# Patient Record
Sex: Male | Born: 1935 | Race: Black or African American | Hispanic: No | Marital: Married | Smoking: Current every day smoker
Health system: Southern US, Community
[De-identification: ages and names within clinical notes are randomized; demographics above are authoritative.]

## PROBLEM LIST (undated history)

## (undated) DIAGNOSIS — K625 Hemorrhage of anus and rectum: Secondary | ICD-10-CM

## (undated) DIAGNOSIS — K5730 Diverticulosis of large intestine without perforation or abscess without bleeding: Secondary | ICD-10-CM

## (undated) DIAGNOSIS — I5189 Other ill-defined heart diseases: Secondary | ICD-10-CM

## (undated) DIAGNOSIS — I10 Essential (primary) hypertension: Secondary | ICD-10-CM

## (undated) DIAGNOSIS — R42 Dizziness and giddiness: Secondary | ICD-10-CM

## (undated) DIAGNOSIS — E876 Hypokalemia: Secondary | ICD-10-CM

## (undated) DIAGNOSIS — H5440 Blindness, one eye, unspecified eye: Secondary | ICD-10-CM

## (undated) DIAGNOSIS — K219 Gastro-esophageal reflux disease without esophagitis: Secondary | ICD-10-CM

## (undated) DIAGNOSIS — I4891 Unspecified atrial fibrillation: Secondary | ICD-10-CM

## (undated) DIAGNOSIS — K5900 Constipation, unspecified: Secondary | ICD-10-CM

## (undated) DIAGNOSIS — M545 Low back pain: Secondary | ICD-10-CM

## (undated) DIAGNOSIS — E1129 Type 2 diabetes mellitus with other diabetic kidney complication: Secondary | ICD-10-CM

## (undated) DIAGNOSIS — E785 Hyperlipidemia, unspecified: Secondary | ICD-10-CM

## (undated) DIAGNOSIS — F17200 Nicotine dependence, unspecified, uncomplicated: Secondary | ICD-10-CM

## (undated) DIAGNOSIS — N259 Disorder resulting from impaired renal tubular function, unspecified: Secondary | ICD-10-CM

## (undated) DIAGNOSIS — J449 Chronic obstructive pulmonary disease, unspecified: Secondary | ICD-10-CM

## (undated) DIAGNOSIS — E119 Type 2 diabetes mellitus without complications: Secondary | ICD-10-CM

## (undated) DIAGNOSIS — I2510 Atherosclerotic heart disease of native coronary artery without angina pectoris: Secondary | ICD-10-CM

## (undated) DIAGNOSIS — R9431 Abnormal electrocardiogram [ECG] [EKG]: Secondary | ICD-10-CM

## (undated) HISTORY — DX: Blindness, one eye, unspecified eye: H54.40

## (undated) HISTORY — DX: Hemorrhage of anus and rectum: K62.5

## (undated) HISTORY — DX: Atherosclerotic heart disease of native coronary artery without angina pectoris: I25.10

## (undated) HISTORY — DX: Dizziness and giddiness: R42

## (undated) HISTORY — DX: Unspecified atrial fibrillation: I48.91

## (undated) HISTORY — PX: EYE SURGERY: SHX253

## (undated) HISTORY — DX: Hyperlipidemia, unspecified: E78.5

## (undated) HISTORY — DX: Type 2 diabetes mellitus without complications: E11.9

## (undated) HISTORY — DX: Other ill-defined heart diseases: I51.89

## (undated) HISTORY — DX: Abnormal electrocardiogram (ECG) (EKG): R94.31

## (undated) HISTORY — DX: Nicotine dependence, unspecified, uncomplicated: F17.200

## (undated) HISTORY — DX: Low back pain: M54.5

## (undated) HISTORY — DX: Gastro-esophageal reflux disease without esophagitis: K21.9

## (undated) HISTORY — DX: Constipation, unspecified: K59.00

## (undated) HISTORY — DX: Diverticulosis of large intestine without perforation or abscess without bleeding: K57.30

## (undated) HISTORY — DX: Chronic obstructive pulmonary disease, unspecified: J44.9

## (undated) HISTORY — DX: Disorder resulting from impaired renal tubular function, unspecified: N25.9

## (undated) HISTORY — DX: Hypokalemia: E87.6

## (undated) HISTORY — DX: Essential (primary) hypertension: I10

## (undated) HISTORY — DX: Type 2 diabetes mellitus with other diabetic kidney complication: E11.29

---

## 1997-11-04 ENCOUNTER — Other Ambulatory Visit: Admission: RE | Admit: 1997-11-04 | Discharge: 1997-11-04 | Payer: Self-pay

## 2001-04-10 ENCOUNTER — Emergency Department (HOSPITAL_COMMUNITY): Admission: EM | Admit: 2001-04-10 | Discharge: 2001-04-10 | Payer: Self-pay | Admitting: Emergency Medicine

## 2003-03-28 ENCOUNTER — Ambulatory Visit (HOSPITAL_COMMUNITY): Admission: EM | Admit: 2003-03-28 | Discharge: 2003-03-28 | Payer: Self-pay | Admitting: Emergency Medicine

## 2004-01-12 ENCOUNTER — Ambulatory Visit: Payer: Self-pay

## 2004-02-02 ENCOUNTER — Ambulatory Visit: Payer: Self-pay | Admitting: Endocrinology

## 2004-02-10 ENCOUNTER — Ambulatory Visit: Payer: Self-pay | Admitting: Internal Medicine

## 2004-03-15 ENCOUNTER — Ambulatory Visit: Payer: Self-pay

## 2004-04-01 ENCOUNTER — Ambulatory Visit: Payer: Self-pay

## 2004-05-19 ENCOUNTER — Ambulatory Visit: Payer: Self-pay | Admitting: Endocrinology

## 2004-10-11 ENCOUNTER — Ambulatory Visit: Payer: Self-pay | Admitting: Endocrinology

## 2004-11-10 ENCOUNTER — Ambulatory Visit: Payer: Self-pay | Admitting: Internal Medicine

## 2004-11-22 ENCOUNTER — Ambulatory Visit: Payer: Self-pay | Admitting: Endocrinology

## 2005-01-09 ENCOUNTER — Ambulatory Visit: Payer: Self-pay | Admitting: Endocrinology

## 2005-01-19 ENCOUNTER — Ambulatory Visit: Payer: Self-pay | Admitting: Endocrinology

## 2005-04-19 ENCOUNTER — Ambulatory Visit: Payer: Self-pay | Admitting: Endocrinology

## 2005-08-09 ENCOUNTER — Ambulatory Visit: Payer: Self-pay | Admitting: Endocrinology

## 2006-01-31 ENCOUNTER — Ambulatory Visit: Payer: Self-pay | Admitting: Endocrinology

## 2006-03-15 ENCOUNTER — Ambulatory Visit: Payer: Self-pay | Admitting: Endocrinology

## 2006-04-05 ENCOUNTER — Ambulatory Visit: Payer: Self-pay | Admitting: Endocrinology

## 2006-07-13 ENCOUNTER — Ambulatory Visit: Payer: Self-pay | Admitting: Endocrinology

## 2006-07-30 ENCOUNTER — Ambulatory Visit: Payer: Self-pay | Admitting: Internal Medicine

## 2006-08-10 ENCOUNTER — Ambulatory Visit: Payer: Self-pay

## 2006-08-10 ENCOUNTER — Ambulatory Visit: Payer: Self-pay | Admitting: Internal Medicine

## 2006-08-20 ENCOUNTER — Ambulatory Visit: Payer: Self-pay | Admitting: Internal Medicine

## 2006-11-03 ENCOUNTER — Emergency Department (HOSPITAL_COMMUNITY): Admission: EM | Admit: 2006-11-03 | Discharge: 2006-11-03 | Payer: Self-pay | Admitting: Emergency Medicine

## 2006-11-19 ENCOUNTER — Ambulatory Visit: Payer: Self-pay | Admitting: Endocrinology

## 2006-12-06 ENCOUNTER — Encounter: Payer: Self-pay | Admitting: *Deleted

## 2006-12-06 DIAGNOSIS — E119 Type 2 diabetes mellitus without complications: Secondary | ICD-10-CM

## 2006-12-06 DIAGNOSIS — E785 Hyperlipidemia, unspecified: Secondary | ICD-10-CM

## 2006-12-06 DIAGNOSIS — R42 Dizziness and giddiness: Secondary | ICD-10-CM | POA: Insufficient documentation

## 2006-12-06 DIAGNOSIS — E1129 Type 2 diabetes mellitus with other diabetic kidney complication: Secondary | ICD-10-CM

## 2006-12-06 DIAGNOSIS — I10 Essential (primary) hypertension: Secondary | ICD-10-CM

## 2006-12-06 DIAGNOSIS — J449 Chronic obstructive pulmonary disease, unspecified: Secondary | ICD-10-CM

## 2006-12-06 DIAGNOSIS — K5730 Diverticulosis of large intestine without perforation or abscess without bleeding: Secondary | ICD-10-CM | POA: Insufficient documentation

## 2006-12-06 DIAGNOSIS — K219 Gastro-esophageal reflux disease without esophagitis: Secondary | ICD-10-CM | POA: Insufficient documentation

## 2006-12-06 DIAGNOSIS — J4489 Other specified chronic obstructive pulmonary disease: Secondary | ICD-10-CM

## 2006-12-06 HISTORY — DX: Other specified chronic obstructive pulmonary disease: J44.89

## 2006-12-06 HISTORY — DX: Dizziness and giddiness: R42

## 2006-12-06 HISTORY — DX: Diverticulosis of large intestine without perforation or abscess without bleeding: K57.30

## 2006-12-06 HISTORY — DX: Gastro-esophageal reflux disease without esophagitis: K21.9

## 2006-12-06 HISTORY — DX: Type 2 diabetes mellitus with other diabetic kidney complication: E11.29

## 2006-12-06 HISTORY — DX: Essential (primary) hypertension: I10

## 2006-12-06 HISTORY — DX: Chronic obstructive pulmonary disease, unspecified: J44.9

## 2006-12-06 HISTORY — DX: Hyperlipidemia, unspecified: E78.5

## 2006-12-06 HISTORY — DX: Type 2 diabetes mellitus without complications: E11.9

## 2007-01-15 ENCOUNTER — Ambulatory Visit: Payer: Self-pay | Admitting: Endocrinology

## 2007-01-16 LAB — CONVERTED CEMR LAB
Basophils Relative: 0.9 % (ref 0.0–1.0)
Bilirubin, Direct: 0.1 mg/dL (ref 0.0–0.3)
CO2: 33 meq/L — ABNORMAL HIGH (ref 19–32)
Cholesterol: 145 mg/dL (ref 0–200)
Creatinine, Ser: 1.1 mg/dL (ref 0.4–1.5)
Creatinine,U: 142.1 mg/dL
Glucose, Bld: 102 mg/dL — ABNORMAL HIGH (ref 70–99)
HCT: 40.5 % (ref 39.0–52.0)
Hemoglobin: 14.1 g/dL (ref 13.0–17.0)
LDL Cholesterol: 89 mg/dL (ref 0–99)
Lymphocytes Relative: 35.9 % (ref 12.0–46.0)
Microalb, Ur: 4.6 mg/dL — ABNORMAL HIGH (ref 0.0–1.9)
Monocytes Absolute: 0.6 10*3/uL (ref 0.2–0.7)
Neutro Abs: 2.8 10*3/uL (ref 1.4–7.7)
Neutrophils Relative %: 49.3 % (ref 43.0–77.0)
Potassium: 4 meq/L (ref 3.5–5.1)
RDW: 14.1 % (ref 11.5–14.6)
Sodium: 137 meq/L (ref 135–145)
TSH: 1.74 microintl units/mL (ref 0.35–5.50)
Total Bilirubin: 0.8 mg/dL (ref 0.3–1.2)
Total Protein: 7.8 g/dL (ref 6.0–8.3)
VLDL: 24 mg/dL (ref 0–40)

## 2007-04-11 ENCOUNTER — Encounter: Payer: Self-pay | Admitting: Endocrinology

## 2007-07-15 ENCOUNTER — Encounter: Payer: Self-pay | Admitting: Endocrinology

## 2007-07-18 ENCOUNTER — Ambulatory Visit: Payer: Self-pay | Admitting: Endocrinology

## 2007-12-05 ENCOUNTER — Encounter: Payer: Self-pay | Admitting: Endocrinology

## 2007-12-12 ENCOUNTER — Emergency Department (HOSPITAL_COMMUNITY): Admission: EM | Admit: 2007-12-12 | Discharge: 2007-12-12 | Payer: Self-pay | Admitting: Emergency Medicine

## 2007-12-20 ENCOUNTER — Ambulatory Visit: Payer: Self-pay | Admitting: Endocrinology

## 2007-12-20 DIAGNOSIS — K5900 Constipation, unspecified: Secondary | ICD-10-CM

## 2007-12-20 DIAGNOSIS — N259 Disorder resulting from impaired renal tubular function, unspecified: Secondary | ICD-10-CM

## 2007-12-20 DIAGNOSIS — K625 Hemorrhage of anus and rectum: Secondary | ICD-10-CM

## 2007-12-20 HISTORY — DX: Constipation, unspecified: K59.00

## 2007-12-20 HISTORY — DX: Disorder resulting from impaired renal tubular function, unspecified: N25.9

## 2007-12-20 HISTORY — DX: Hemorrhage of anus and rectum: K62.5

## 2008-01-21 ENCOUNTER — Ambulatory Visit: Payer: Self-pay | Admitting: Endocrinology

## 2008-01-21 DIAGNOSIS — M545 Low back pain, unspecified: Secondary | ICD-10-CM

## 2008-01-21 HISTORY — DX: Low back pain, unspecified: M54.50

## 2008-04-23 ENCOUNTER — Ambulatory Visit: Payer: Self-pay | Admitting: Endocrinology

## 2008-12-11 ENCOUNTER — Ambulatory Visit: Payer: Self-pay | Admitting: Endocrinology

## 2008-12-11 LAB — CONVERTED CEMR LAB
BUN: 12 mg/dL (ref 6–23)
Creatinine, Ser: 1 mg/dL (ref 0.4–1.5)
Glucose, Bld: 135 mg/dL — ABNORMAL HIGH (ref 70–99)
Hgb A1c MFr Bld: 6.4 % (ref 4.6–6.5)
Microalb Creat Ratio: 319.5 mg/g — ABNORMAL HIGH (ref 0.0–30.0)
Sodium: 138 meq/L (ref 135–145)

## 2009-01-12 ENCOUNTER — Ambulatory Visit: Payer: Self-pay | Admitting: Endocrinology

## 2009-01-12 DIAGNOSIS — E876 Hypokalemia: Secondary | ICD-10-CM

## 2009-01-12 HISTORY — DX: Hypokalemia: E87.6

## 2009-05-04 ENCOUNTER — Ambulatory Visit: Payer: Self-pay | Admitting: Endocrinology

## 2009-05-04 DIAGNOSIS — I4891 Unspecified atrial fibrillation: Secondary | ICD-10-CM

## 2009-05-04 HISTORY — DX: Unspecified atrial fibrillation: I48.91

## 2009-05-05 ENCOUNTER — Telehealth (INDEPENDENT_AMBULATORY_CARE_PROVIDER_SITE_OTHER): Payer: Self-pay | Admitting: *Deleted

## 2009-05-07 ENCOUNTER — Ambulatory Visit: Payer: Self-pay | Admitting: Cardiology

## 2009-05-07 LAB — CONVERTED CEMR LAB

## 2009-05-13 ENCOUNTER — Ambulatory Visit: Payer: Self-pay | Admitting: Internal Medicine

## 2009-05-13 ENCOUNTER — Ambulatory Visit: Payer: Self-pay | Admitting: Cardiology

## 2009-05-13 DIAGNOSIS — F17200 Nicotine dependence, unspecified, uncomplicated: Secondary | ICD-10-CM

## 2009-05-13 DIAGNOSIS — I2510 Atherosclerotic heart disease of native coronary artery without angina pectoris: Secondary | ICD-10-CM

## 2009-05-13 DIAGNOSIS — R9431 Abnormal electrocardiogram [ECG] [EKG]: Secondary | ICD-10-CM

## 2009-05-13 HISTORY — DX: Nicotine dependence, unspecified, uncomplicated: F17.200

## 2009-05-13 HISTORY — DX: Atherosclerotic heart disease of native coronary artery without angina pectoris: I25.10

## 2009-05-13 HISTORY — DX: Abnormal electrocardiogram (ECG) (EKG): R94.31

## 2009-05-13 LAB — CONVERTED CEMR LAB: POC INR: 2.6

## 2009-05-28 ENCOUNTER — Ambulatory Visit: Payer: Self-pay | Admitting: Cardiology

## 2009-05-28 ENCOUNTER — Ambulatory Visit: Payer: Self-pay

## 2009-05-28 ENCOUNTER — Ambulatory Visit (HOSPITAL_COMMUNITY): Admission: RE | Admit: 2009-05-28 | Discharge: 2009-05-28 | Payer: Self-pay | Admitting: Internal Medicine

## 2009-05-28 ENCOUNTER — Encounter: Payer: Self-pay | Admitting: Internal Medicine

## 2009-11-26 ENCOUNTER — Ambulatory Visit: Payer: Self-pay | Admitting: Endocrinology

## 2009-11-26 DIAGNOSIS — J209 Acute bronchitis, unspecified: Secondary | ICD-10-CM

## 2009-11-26 DIAGNOSIS — R509 Fever, unspecified: Secondary | ICD-10-CM

## 2009-11-26 LAB — CONVERTED CEMR LAB
BUN: 11 mg/dL (ref 6–23)
Basophils Absolute: 0.1 10*3/uL (ref 0.0–0.1)
Basophils Relative: 1.3 % (ref 0.0–3.0)
Chloride: 100 meq/L (ref 96–112)
Eosinophils Absolute: 0.4 10*3/uL (ref 0.0–0.7)
Lymphocytes Relative: 34.3 % (ref 12.0–46.0)
MCHC: 34.4 g/dL (ref 30.0–36.0)
Neutrophils Relative %: 45.7 % (ref 43.0–77.0)
Potassium: 4.3 meq/L (ref 3.5–5.1)
RBC: 4.09 M/uL — ABNORMAL LOW (ref 4.22–5.81)

## 2010-04-10 LAB — CONVERTED CEMR LAB
Albumin: 3.9 g/dL (ref 3.5–5.2)
Alkaline Phosphatase: 52 units/L (ref 39–117)
Basophils Absolute: 0 10*3/uL (ref 0.0–0.1)
Basophils Relative: 0 % (ref 0.0–3.0)
Bilirubin, Direct: 0.1 mg/dL (ref 0.0–0.3)
CO2: 31 meq/L (ref 19–32)
Chloride: 103 meq/L (ref 96–112)
Creatinine, Ser: 1 mg/dL (ref 0.4–1.5)
Crystals: NEGATIVE
Eosinophils Absolute: 0.2 10*3/uL (ref 0.0–0.7)
Eosinophils Absolute: 0.4 10*3/uL (ref 0.0–0.7)
Eosinophils Relative: 4.5 % (ref 0.0–5.0)
GFR calc Af Amer: 91 mL/min
GFR calc non Af Amer: 75 mL/min
Glucose, Bld: 104 mg/dL — ABNORMAL HIGH (ref 70–99)
HDL: 27.6 mg/dL — ABNORMAL LOW (ref >39.0)
Hemoglobin, Urine: NEGATIVE
Hemoglobin: 13.8 g/dL (ref 13.0–17.0)
Hgb A1c MFr Bld: 6.6 % — ABNORMAL HIGH (ref 4.6–6.0)
INR: 1.1 — ABNORMAL HIGH (ref 0.8–1.0)
LDL Cholesterol: 66 mg/dL (ref 0–99)
Leukocytes, UA: NEGATIVE
Leukocytes, UA: NEGATIVE
Lymphs Abs: 2.3 10*3/uL (ref 0.7–4.0)
MCHC: 33.3 g/dL (ref 30.0–36.0)
MCV: 93.5 fL (ref 78.0–100.0)
MCV: 97.8 fL (ref 78.0–100.0)
Microalb Creat Ratio: 400 mg/g — ABNORMAL HIGH (ref 0.0–30.0)
Monocytes Absolute: 0.6 10*3/uL (ref 0.1–1.0)
Neutro Abs: 2.8 10*3/uL (ref 1.4–7.7)
Neutrophils Relative %: 48.4 % (ref 43.0–77.0)
Nitrite: NEGATIVE
PTH: 53.5 pg/mL (ref 14.0–72.0)
Platelets: 190 10*3/uL (ref 150–400)
Potassium: 3.7 meq/L (ref 3.5–5.1)
Prothrombin Time: 11.5 s (ref 9.1–11.7)
RBC: 4.24 M/uL (ref 4.22–5.81)
RDW: 12.9 % (ref 11.5–14.6)
Sodium: 138 meq/L (ref 135–145)
Sodium: 141 meq/L (ref 135–145)
Specific Gravity, Urine: 1.025 (ref 1.000–1.03)
TSH: 1.45 microintl units/mL (ref 0.35–5.50)
TSH: 1.62 microintl units/mL (ref 0.35–5.50)
Total CHOL/HDL Ratio: 3
Total CHOL/HDL Ratio: 4.5
Total Protein: 7.9 g/dL (ref 6.0–8.3)
Triglycerides: 172 mg/dL — ABNORMAL HIGH (ref 0.0–149.0)
Urine Glucose: NEGATIVE mg/dL
Urine Glucose: NEGATIVE mg/dL
Urobilinogen, UA: 0.2 (ref 0.0–1.0)
Urobilinogen, UA: 0.2 (ref 0.0–1.0)
VLDL: 31 mg/dL (ref 0–40)
WBC: 5.3 10*3/uL (ref 4.5–10.5)

## 2010-04-14 NOTE — Assessment & Plan Note (Signed)
Summary: f/u appt/#cd   Vital Signs:  Patient profile:   77 year old male Height:      63 inches (160.02 cm) Weight:      162.50 pounds (73.86 kg) BMI:     28.89 O2 Sat:      98 % on Room air Temp:     100.5 degrees F (38.06 degrees C) oral Pulse rate:   96 / minute BP sitting:   138 / 86  (left arm) Cuff size:   regular  Vitals Entered By: Brenton Grills MA (November 26, 2009 2:04 PM)  O2 Flow:  Room air CC: follow-up visit/pt is no longer taking Klor-Con/aj Is Patient Diabetic? Yes   Primary Provider:  Dr. Everardo All  CC:  follow-up visit/pt is no longer taking Klor-Con/aj.  History of Present Illness: pt states 1-2 days of slight prod cough in the chest, and assoc nasal congestion.  he also has low-grade fever  Allergies: 1)  ! Penicillin  Past History:  Past Medical History: Last updated: 05/12/2009 1. HYPERTENSION  2. HYPERLIPIDEMIA   3. ATRIAL FIBRILLATION   4. HEPATOTOXICITY, DRUG-INDUCED, RISK OF   5. COPD  6. HYPOKALEMIA  7. BACK PAIN, LUMBAR   8. RECTAL BLEEDING   9. CONSTIPATION  10. RENAL INSUFFICIENCY  11. NEPHROPATHY, DIABETIC   12. GERD   13. Dizziness or Vertigo 14. DIVERTICULOSIS, COLON   15. DIABETES MELLITUS, TYPE II   16. (R) Foot Rash 17. Blindness in (R) eye 18. Zoster 19. (L) Vh on echo 20. Diastolic Dysfunction  Review of Systems  The patient denies dyspnea on exertion.         denies n/v  Physical Exam  General:  elderly, frail, no distress  Head:  head: no deformity eyes: no periorbital swelling, no proptosis external nose and ears are normal mouth: no lesion seen Lungs:  Clear to auscultation bilaterally, except for a few rales at the right base. Normal respiratory effort.  Additional Exam:  Hemoglobin A1C       [H]  6.8 %     Sodium                    139 mEq/L                   135-145   Potassium                 4.3 mEq/L                   3.5-5.1   Chloride                  100 mEq/L                   96-112  Carbon Dioxide       [H]  33 mEq/L                    19-32   Glucose              [H]  115 mg/dL                   04-54   BUN                       11 mg/dL                    0-98   Creatinine  0.9 mg/dL                   1.6-1.0   Calcium                   9.7 mg/dL       Impression & Recommendations:  Problem # 1:  BRONCHITIS, ACUTE (ICD-466.0) Assessment New  Problem # 2:  DIABETES MELLITUS, TYPE II (ICD-250.00) well-controlled  Problem # 3:  RENAL INSUFFICIENCY (ICD-588.9) Assessment: Improved  Medications Added to Medication List This Visit: 1)  Levaquin 750 Mg Tabs (Levofloxacin) .... 1/2 tab once daily 2)  Promethazine-codeine 6.25-10 Mg/49ml Syrp (Promethazine-codeine) .Marland Kitchen.. 1 teaspoon every 4 hrs as needed for cough 3)  Advair Diskus 100-50 Mcg/dose Aepb (Fluticasone-salmeterol) .Marland Kitchen.. 1 puff two times a day  Other Orders: T-2 View CXR (71020TC) TLB-A1C / Hgb A1C (Glycohemoglobin) (83036-A1C) TLB-CBC Platelet - w/Differential (85025-CBCD) TLB-BMP (Basic Metabolic Panel-BMET) (80048-METABOL) Est. Patient Level IV (96045)   Patient Instructions: 1)  blood tests and a chest x ray are being ordered for you today.  please call [phone] to hear your test results. 2)  "levaquin" 1/2 of 750 mg once daily 3)  promethiazine-codeine syrup:  1 teaspoon every 4 hrs as needed for cough 4)  here is a sample of 'advair"-100.  take 1 puff two times a day.  rinse mouth after using. 5)  Please schedule a physical appointment in 5 months. 6)  (update: i left message on phone-tree:  rx as we discussed) Prescriptions: PROMETHAZINE-CODEINE 6.25-10 MG/5ML SYRP (PROMETHAZINE-CODEINE) 1 teaspoon every 4 hrs as needed for cough  #8 oz x 1   Entered and Authorized by:   Minus Breeding MD   Signed by:   Minus Breeding MD on 11/26/2009   Method used:   Print then Give to Patient   RxID:   [id number] LEVAQUIN 750 MG TABS (LEVOFLOXACIN) 1/2 tab once daily  #3 x 0   Entered  and Authorized by:   Minus Breeding MD   Signed by:   Minus Breeding MD on 11/26/2009   Method used:   [facility] to        [facility]  [address] [id number]       [address]       [place], [place]  [address]       Ph: [phone] or [phone]       Fax: [phone]   RxID:   [id number]

## 2010-04-14 NOTE — Medication Information (Signed)
Summary: new to coumadin/afib  Anticoagulant Therapy  Managed by: Bethena Midget, RN, BSN Referring MD: Gala Romney MD, Reuel Boom PCP: Dr. Philmore Pali MD: Jens Som MD, Arlys John Indication 1: Atrial Fibrillation Lab Used: [facility] [place] Site: [address] INR POC 1.1 INR RANGE 2.0-3.0  Dietary changes: no    Health status changes: no    Bleeding/hemorrhagic complications: no    Recent/future hospitalizations: no    Any changes in medication regimen? no    Recent/future dental: no  Any missed doses?: no       Is patient compliant with meds? yes      Comments: New Pt. seen in coumadin clinic today, referral from Dr. Everardo All for AFib. New pt. education completed.   Allergies: 1)  ! Penicillin 2)  ! * Asprin  Anticoagulation Management History:      The patient comes in today for his initial visit for anticoagulation therapy.  Positive risk factors for bleeding include an age of 44 years or older and presence of serious comorbidities.  The bleeding index is 'intermediate risk'.  Positive CHADS2 values include History of HTN, Age > 51 years old, and History of Diabetes.  His last INR was 1.1 ratio.  Anticoagulation responsible provider: Jens Som MD, Arlys John.  INR POC: 1.1.  Cuvette Lot#: [id number].  Exp: 07/2010.    Anticoagulation Management Assessment/Plan:      The patient's current anticoagulation dose is Warfarin sodium 5 mg tabs: Use as directed by Anticoagulation Clinic.  The next INR is due 05/13/2009.  Anticoagulation instructions were given to patient/spouse.  Results were reviewed/authorized by Bethena Midget, RN, BSN.  He was notified by Bethena Midget, RN, BSN.         Current Anticoagulation Instructions: INR 1.1 Take 1/2 pill everyday. Recheck in one week.  Prescriptions: WARFARIN SODIUM 5 MG TABS (WARFARIN SODIUM) Use as directed by Anticoagulation Clinic  #30 x 3   Entered by:   Bethena Midget, RN, BSN   Authorized by:   Dolores Patty, MD,  [facility]   Signed by:   Bethena Midget, RN, BSN on 05/07/2009   Method used:   [facility]  [address] [id number]       [address]       [place], [place]  [address]       Ph: [phone] or [phone]       Fax: [phone]   RxID:   [id number]

## 2010-04-14 NOTE — Medication Information (Signed)
Summary: rov/tm  Anticoagulant Therapy  Managed by: Inactive Referring MD: Danny Romney MD, Danny [name] PCP: Dr. Philmore Pali MD: Danny Som MD, Danny [name] Indication 1: Atrial Fibrillation Lab Used: [facility] [place] Site: [address] INR POC 2.6 INR RANGE 2.0-3.0      Any changes in medication regimen? no     Any missed doses?: no        Comments: Pt is not in atrial fibrillation when seen today by Dr Danny [name], coumadin discontinued. Danny Reams RN  May 13, 2009 4:03 PM   Allergies: 1)  ! Penicillin 2)  ! * Asprin  Anticoagulation Management History:      The patient is taking warfarin and comes in today for a routine follow up visit.  Positive risk factors for bleeding include an age of 75 years or older and presence of serious comorbidities.  The bleeding index is 'intermediate risk'.  Positive CHADS2 values include History of HTN, Age > 75 years old, and History of Diabetes.  His last INR was 1.1 ratio.  Anticoagulation responsible provider: Jens Som MD, Danny [name].  INR POC: 2.6.  Exp: 07/2010.    Anticoagulation Management Assessment/Plan:      The patient's current anticoagulation dose is Warfarin sodium 5 mg tabs: Use as directed by Anticoagulation Clinic.  The next INR is due 05/13/2009.  Anticoagulation instructions were given to patient/spouse.  Results were reviewed/authorized by Inactive.         Prior Anticoagulation Instructions: INR 1.1 Take 1/2 pill everyday. Recheck in one week.

## 2010-04-14 NOTE — Assessment & Plan Note (Signed)
Summary: f1y/afib   Visit Type:  Follow-up Primary Provider:  Dr. Everardo All  CC:  no complaints.  History of Present Illness: [name] is a delightful 77 year old male with coronary artery disease, which is being managed medically.   His history is also significant for: 1. Hypertension. 2. Diabetes. 3. Hyperlipidemia. 4. COPD with ongoing tobacco use.   He had an echocardiogram in November 2005, which showed an EF 45% to 50% with anterior and inferior hypokineses.  Adenosine Myoview in January 2006 showed an EF of 48% with a fixed defect in the inferior wall. There was no ischemia.     Saw Dr. Everardo All two weeks ago for routine f/u and told he was in atrial fib and strated on coumadin. Referred here for f/u.  Doing well. Denies any palpitations. No change in exercise tolerance. No CP. No orthopnea or PND. Mild dyspnea with exertion. No change. No edema. + nightime cough. Trying to quit smoking. No problems with his medication.   Current Medications (verified): 1)  Aspirin 81 Mg Tbec (Aspirin) .... Take One Tablet By Mouth Daily 2)  Crestor 40 Mg  Tabs (Rosuvastatin Calcium) .... Take 1 By Mouth Qhs 3)  Felodipine 10 Mg  Tb24 (Felodipine) .... Take 1 By Mouth Qd 4)  Isosorbide Dinitrate 20 Mg  Tabs (Isosorbide Dinitrate) .... Take 1 By Mouth Qd 5)  Hydralazine Hcl 10 Mg Tabs (Hydralazine Hcl) .... Tid 6)  Klor-Con M10 10 Meq Cr-Tabs (Potassium Chloride Crys Cr) .Marland Kitchen.. 1 Qd 7)  Actos 45 Mg Tabs (Pioglitazone Hcl) .Marland Kitchen.. 1 Qd 8)  Warfarin Sodium 5 Mg Tabs (Warfarin Sodium) .... Use As Directed By Anticoagulation Clinic  Allergies: 1)  ! Penicillin  Past History:  Past Medical History: Last updated: 05/12/2009 1. HYPERTENSION  2. HYPERLIPIDEMIA   3. ATRIAL FIBRILLATION   4. HEPATOTOXICITY, DRUG-INDUCED, RISK OF   5. COPD  6. HYPOKALEMIA  7. BACK PAIN, LUMBAR   8. RECTAL BLEEDING   9. CONSTIPATION  10. RENAL INSUFFICIENCY  11. NEPHROPATHY, DIABETIC   12. GERD   13.  Dizziness or Vertigo 14. DIVERTICULOSIS, COLON   15. DIABETES MELLITUS, TYPE II   16. (R) Foot Rash 17. Blindness in (R) eye 18. Zoster 19. (L) Vh on echo 20. Diastolic Dysfunction  Family History: Last updated: 05/12/2009  Significant for hypertension. no cancer  Social History: Last updated: 04/23/2008 he is retired  he is married  Risk Factors: Smoking Status: current (12/06/2006)  Family History: Reviewed history from 05/12/2009 and no changes required.  Significant for hypertension. no cancer  Social History: Reviewed history from 04/23/2008 and no changes required. he is retired  he is married  Review of Systems       As per HPI and past medical history; otherwise all systems negative.   Vital Signs:  Patient profile:   77 year old male Height:      63 inches Weight:      168 pounds Pulse rate:   56 / minute BP sitting:   188 / 68  (left arm) Cuff size:   regular  Vitals Entered By: Hardin Negus, RMA (May 13, 2009 2:54 PM)  Physical Exam  General:  elderly, frail, no distress Gen: well appearing. no resp difficulty HEENT: normal  Neck: supple. no JVD. Carotids 2+ bilat; no bruits.  Cor: PMI nonpalpable very distant heart sounds  IrregularNo rubs, gallops, murmur. Lungs:diffuse rhonchi Abdomen: soft, nontender, nondistended. No hepatosplenomegaly. No bruits or masses. Good bowel sounds. Extremities: no cyanosis, clubbing,  rash, edema Neuro: alert & orientedx3, cranial nerves grossly intact. moves all 4 extremities w/o difficulty. affect pleasant    Impression & Recommendations:  Problem # 1:  ABNORMAL EKG (ICD-794.31) I have reviewed the ECG from his Feb 22 visit with Dr. Everardo All and in looking at it closely it appears to be sinus rhtyhm with frequent PACs/PVCs and foruntately not atrial fib. Thus will stop his coumadin and continue aspirin 81.   Problem # 2:  CORONARY ATHEROSCLEROSIS, NATIVE VESSEL (ICD-414.01) Stable. No evidence of  ischemia. Continue current regimen. Will check echo to make sure EF stable.  Problem # 3:  TOBACCO USER (ICD-305.1) Encouraged to quit smoking.  Other Orders: EKG w/ Interpretation (93000) Echocardiogram (Echo)  Patient Instructions: 1)  Stop coumadin 2)  Your physician discussed the risks, benefits and indications for preventive aspirin therapy. It is recommended that you start (or continue) taking 81 mg of aspirin a day. 3)  Your physician has requested that you have an echocardiogram.  Echocardiography is a painless test that uses sound waves to create images of your heart. It provides your doctor with information about the size and shape of your heart and how well your heart's chambers and valves are working.  This procedure takes approximately one hour. There are no restrictions for this procedure. 4)  Follow up in 1 year

## 2010-04-14 NOTE — Progress Notes (Signed)
----   Converted from flag ---- ---- 05/05/2009 9:31 AM, Edman Circle wrote: appt 2/25 @ 10:30 Coumadin & 3/3 @ 3:30 with Bensimhon  ---- 05/04/2009 10:31 AM, Dagoberto Reef wrote: Thanks  ---- 05/04/2009 10:18 AM, Minus Breeding MD wrote: The following orders have been entered for this patient and placed on Admin Hold:  Type:     Referral       Code:   Coumadin clinic Description:   Danny [name]. Coumadin Clinic Referral Order Date:   05/04/2009   Authorized By:   Minus Breeding MD Order #:   [id number] Clinical Notes:   please start coumadin  Type:     Referral       Code:   Cardiology Description:   Cardiology Referral Order Date:   05/04/2009   Authorized By:   Minus Breeding MD Order #:   [id number] Clinical Notes:   needs f/u appt with dr bensimhon ------------------------------

## 2010-04-14 NOTE — Assessment & Plan Note (Signed)
Summary: FEB YEARLY PER PT--#--STC   Vital Signs:  Patient profile:   77 year old male Height:      63 inches (160.02 cm) Weight:      169 pounds (76.82 kg) O2 Sat:      96 % on Room air Temp:     98.1 degrees F (36.72 degrees C) oral Pulse rate:   70 / minute BP sitting:   150 / 78  (left arm) Cuff size:   regular  Vitals Entered By: Josph Macho RMA (May 04, 2009 9:06 AM)  O2 Flow:  Room air CC: Yearly/ CF   Primary Provider:  Dr. Everardo All  CC:  Yearly/ CF.  History of Present Illness: here for regular wellness examination.  He's feeling pretty well in general, and does not drink etoh.  Current Medications (verified): 1)  Ecotrin 325 Mg  Tbec (Aspirin) .... Take 1 By Mouth Qd 2)  Crestor 40 Mg  Tabs (Rosuvastatin Calcium) .... Take 1 By Mouth Qhs 3)  Felodipine 10 Mg  Tb24 (Felodipine) .... Take 1 By Mouth Qd 4)  Isosorbide Dinitrate 20 Mg  Tabs (Isosorbide Dinitrate) .... Take 1 By Mouth Qd 5)  Zestoretic 20-25 Mg  Tabs (Lisinopril-Hydrochlorothiazide) .... Qd 6)  Hydralazine Hcl 10 Mg Tabs (Hydralazine Hcl) .... Tid 7)  Furosemide 20 Mg Tabs (Furosemide) .Marland Kitchen.. 1 Qd 8)  Klor-Con M10 10 Meq Cr-Tabs (Potassium Chloride Crys Cr) .Marland Kitchen.. 1 Qd 9)  Actos 45 Mg Tabs (Pioglitazone Hcl) .Marland Kitchen.. 1 Qd  Family History: Reviewed history from 01/15/2007 and no changes required. no cancer  Social History: Reviewed history from 04/23/2008 and no changes required. he is retired  he is married  Review of Systems  The patient denies fever, weight loss, weight gain, vision loss, decreased hearing, chest pain, syncope, prolonged cough, headaches, abdominal pain, melena, hematochezia, severe indigestion/heartburn, hematuria, suspicious skin lesions, and depression.         denies decreased urinary stream.  Physical Exam  General:  elderly, frail, no distress  Head:  head: no deformity eyes: no periorbital swelling, no proptosis external nose and ears are normal mouth: no  lesion seen Neck:  Supple without thyroid enlargement or tenderness. No cervical lymphadenopathy, neck masses or tracheal deviation.  Lungs:  Clear to auscultation bilaterally. Normal respiratory effort.  Abdomen:  abdomen is soft, nontender.  no hepatosplenomegaly.   not distended.  no hernia  Rectal:  normal external and internal exam.  heme neg  Prostate:  Normal size prostate without masses or tenderness.  Msk:  muscle bulk and strength are grossly normal for age.  no obvious joint swelling.  gait is normal and steady  Pulses:  dorsalis pedis intact bilat.  no carotid bruit  Neurologic:  cn 2-12 grossly intact.   readily moves all 4's.   sensation is intact to touch on the feet  Skin:  normal texture and temp.  not diaphoretic  Cervical Nodes:  No significant adenopathy.  Psych:  Alert and cooperative; normal mood and affect; normal attention span and concentration.   Additional Exam:  SEPARATE EVALUATION FOLLOWS--EACH PROBLEM HERE IS NEW, NOT RESPONDING TO TREATMENT, OR POSES SIGNIFICANT RISK TO THE PATIENT'S HEALTH: HISTORY OF THE PRESENT ILLNESS: pt states chronic intermittent palpitations he reports itchy rash on the right foot PAST MEDICAL HISTORY reviewed and up to date today REVIEW OF SYSTEMS: denies falls and sob PHYSICAL EXAMINATION: see vs page irreg rhythm, normal rate, no murmur no deformity.  no ulcer on the feet.  feet are of normal color and temp.  no edema.  ther is an eczematous rash on the plantar aspect of the right foot. LAB/XRAY RESULTS: tsh=normal ecg: afib with normal ventricular rate IMPRESSION: atrial fibrillation, new tinea pedis PLAN: see instruction sheet   Impression & Recommendations:  Problem # 1:  ROUTINE GENERAL MEDICAL EXAM@HEALTH  CARE FACL (ICD-V70.0)  Other Orders: T-Parathyroid Hormone, Intact w/ Calcium (57846-96295) EKG w/ Interpretation (93000) Cardiology Referral (Cardiology) TLB-PT (Protime) (85610-PTP) [address]  Coumadin Clinic Referral (Coumadin clinic) TLB-Lipid Panel (80061-LIPID) TLB-BMP (Basic Metabolic Panel-BMET) (80048-METABOL) TLB-CBC Platelet - w/Differential (85025-CBCD) TLB-Hepatic/Liver Function Pnl (80076-HEPATIC) TLB-TSH (Thyroid Stimulating Hormone) (84443-TSH) TLB-Udip w/ Micro (81001-URINE) TLB-A1C / Hgb A1C (Glycohemoglobin) (83036-A1C) TLB-PSA (Prostate Specific Antigen) (84153-PSA) Est. Patient Level III (28413) Est. Patient 65& > (24401)   Patient Instructions: 1)  you will be called for an appointment with dr bensimhon, and with the blood-thinner specialist. 2)  tests are being ordered for you today.  a few days after the test(s), please call [phone] to hear your test results. 3)  return 6 months 4)  apply clotrimazole (non-prescription) cream to the right foot rash three times a day, until better. 5)  we discussed the recommendations of the preventive services task force.  in particular, we discussed the need to quit smoking.  he says he does not wish to quit.

## 2010-05-16 ENCOUNTER — Ambulatory Visit (INDEPENDENT_AMBULATORY_CARE_PROVIDER_SITE_OTHER): Payer: Medicare Other | Admitting: Internal Medicine

## 2010-05-16 ENCOUNTER — Encounter: Payer: Self-pay | Admitting: Internal Medicine

## 2010-05-16 DIAGNOSIS — I359 Nonrheumatic aortic valve disorder, unspecified: Secondary | ICD-10-CM

## 2010-05-16 DIAGNOSIS — I2510 Atherosclerotic heart disease of native coronary artery without angina pectoris: Secondary | ICD-10-CM

## 2010-05-16 DIAGNOSIS — I10 Essential (primary) hypertension: Secondary | ICD-10-CM

## 2010-05-24 NOTE — Assessment & Plan Note (Signed)
Summary: FOLLOW UP - 1 YEAR/mj   Visit Type:  Follow-up Primary Provider:  Dr. Everardo All  CC:  Some dizziness.  History of Present Illness: Mr. Allcock is a delightful 77 year old male with presumed coronary artery disease, which is being managed medically.  His history is also significant for: 1. Hypertension. 2. Diabetes. 3. Hyperlipidemia. 4. COPD with ongoing tobacco use.   He had an echocardiogram in March 2011, which showed an EF 50% to 55% with mild AS/AI. RV was normal.  Adenosine Myoview in January 2006 showed an EF of 48% with a fixed defect in the inferior wall. There was no ischemia.    Doing well. No change in exercise tolerance. No CP. No orthopnea or PND. Mild dyspnea with exertion. Gets dizzy occasionally - if he turns around too quickly. Occurs about 1x/week. No falls or syncope. No orthostasis. Still works security 3 days per month.   Started on coumadin by Dr. Everardo All in 2/11 for presumed AF. However, ran out of coumadin a while back and didn't have refills so he never restarted. Taking ASA 325. No problems with bleeding.   Smokes about half pack/day.   Current Medications (verified): 1)  Aspirin 81 Mg Tbec (Aspirin) .... Take One Tablet By Mouth Daily 2)  Crestor 40 Mg  Tabs (Rosuvastatin Calcium) .... Take 1 By Mouth Qhs 3)  Felodipine 10 Mg  Tb24 (Felodipine) .... Take 1 By Mouth Qd 4)  Hydralazine Hcl 10 Mg Tabs (Hydralazine Hcl) .... Tid 5)  Klor-Con M10 10 Meq Cr-Tabs (Potassium Chloride Crys Cr) .... Take 1 Tablet By Mouth Once A Day  Allergies: 1)  ! Penicillin  Past History:  Past Medical History: 1. HYPERTENSION  2. HYPERLIPIDEMIA   3. Presumed CAD 4. HEPATOTOXICITY, DRUG-INDUCED, RISK OF   5. COPD  6. HYPOKALEMIA  7. BACK PAIN, LUMBAR   8. RECTAL BLEEDING   9. CONSTIPATION  10. RENAL INSUFFICIENCY  11. NEPHROPATHY, DIABETIC   12. GERD   13. Dizziness or Vertigo 14. DIVERTICULOSIS, COLON   15. DIABETES MELLITUS, TYPE II   16. (R)  Foot Rash 17. Blindness in (R) eye 18. Zoster 19. (L) Vh on echo 20. Diastolic Dysfunction  Vital Signs:  Patient profile:   77 year old male Height:      63 inches Weight:      148 pounds BMI:     26.31 Pulse rate:   60 / minute Pulse rhythm:   irregular Resp:     18 per minute BP sitting:   150 / 72  (left arm) Cuff size:   large  Vitals Entered By: Vikki Ports (May 16, 2010 10:38 AM)  Physical Exam  General:  elderly, frail, no distress HEENT: normal Neck: supple. no JVD. Carotids 2+ bilat; no bruits. No lymphadenopathy or thryomegaly appreciated. Cor: PMI nondisplaced.  Distant HS Regular rate & rhythm. No rubs, gallops. soft systolic murmur at RUSB. Lungs: clear with decreased BS throughout Abdomen: soft, nontender, nondistended. No hepatosplenomegaly. No bruits or masses. Good bowel sounds. Extremities: no cyanosis, clubbing, rash, edema Neuro: alert & orientedx3, cranial nerves grossly intact. moves all 4 extremities w/o difficulty. affect pleasant    Impression & Recommendations:  Problem # 1:  CORONARY ATHEROSCLEROSIS, NATIVE VESSEL (ICD-414.01) Stable. No evidence of ischemia. Continue current regimen. HR too low for b-blocker.   Problem # 2:  ATRIAL FIBRILLATION (ICD-427.31) I reviewed all his ECGs in chart including one from 05/04/2009 which was interpreted as AF. However, this is sinus rhythm with  frequent PACs, thus no evidence of AF. Will continue ASA - no coumadin.   Problem # 3:  TOBACCO USER (ICD-305.1) Counseled on need to stop smoking but he is relatively uniterested in quitting.   Problem # 4:  AORTIC STENOSIS Very mild. Asymtpomatic.   Problem # 5:  HYPERTENSION (ICD-401.9) BP elevated. Given DM2 and CAD will start lisinopril 10 once daily. Check BMET in 1-2 weeks. F/u with Dr. Everardo All.   Other Orders: EKG w/ Interpretation (93000)  Patient Instructions: 1)  Start Lisinopril 10mg  daily 2)  Your physician recommends that you return for  lab work in: 1-2 weeks (bmet 401.1) 3)  Your physician wants you to follow-up in:  1 year.  You will receive a reminder letter in the mail two months in advance. If you don't receive a letter, please call our office to schedule the follow-up appointment. Prescriptions: LISINOPRIL 10 MG TABS (LISINOPRIL) Take one tablet by mouth daily  #30 x 12   Entered by:   Meredith Staggers, RN   Authorized by:   Dolores Patty, MD, [facility]   Signed by:   Meredith Staggers, RN on 05/16/2010   Method used:   [facility]  [address] [id number]       [address]       [place], [place]  [address]       Ph: [phone] or [phone]       Fax: [phone]   RxID:   [id number]

## 2010-05-30 ENCOUNTER — Other Ambulatory Visit (INDEPENDENT_AMBULATORY_CARE_PROVIDER_SITE_OTHER): Payer: Medicare Other

## 2010-05-30 ENCOUNTER — Encounter: Payer: Self-pay | Admitting: Internal Medicine

## 2010-05-30 ENCOUNTER — Other Ambulatory Visit: Payer: Self-pay | Admitting: Internal Medicine

## 2010-05-30 DIAGNOSIS — I10 Essential (primary) hypertension: Secondary | ICD-10-CM

## 2010-05-30 LAB — BASIC METABOLIC PANEL
CO2: 29 mEq/L (ref 19–32)
Chloride: 103 mEq/L (ref 96–112)
Glucose, Bld: 100 mg/dL — ABNORMAL HIGH (ref 70–99)
Potassium: 3.8 mEq/L (ref 3.5–5.1)
Sodium: 136 mEq/L (ref 135–145)

## 2010-06-06 ENCOUNTER — Other Ambulatory Visit: Payer: Self-pay | Admitting: Endocrinology

## 2010-07-26 NOTE — Assessment & Plan Note (Signed)
[facility]                            CARDIOLOGY OFFICE NOTE   KWASI, JOUNG                     MRN:          [id number]  DATE:07/30/2006                            DOB:          07-30-1920    PRIMARY CARE PHYSICIAN:  Romero Belling, M.D.   HISTORY OF PRESENT ILLNESS:  Mr. Deupree is an 77 year old male with a  history of presumed coronary artery disease with an EF of 48%, as well  as hypertension, diabetes, and COPD with ongoing tobacco use, who  presents today for routine followup. We have not seen him since August  of 2006.   Overall, he says he is doing well. He denies any chest pain or  significant dyspnea on exertion. His main complaint is that he has daily  episodes of brief dizziness. He says that this is most common when he is  getting out of a car and starting to walk. He gets dizzy for 3 or 4  minutes and then it goes away. He has not had any syncope. This has  never occurred at rest. He denies having a similar feeling when he is  getting up in the morning. He did have an episode about 2 weeks ago  where he was walking down the stairs to the basement and felt light  headed and actually fell and hurt his left shoulder. He denies any  associated loss of consciousness. There is no palpitations.   PAST MEDICAL HISTORY:  As noted above. For full list, please see my note  of November 10, 2004.   CURRENT MEDICATIONS:  Actos 45 mg 1/2 tablet daily, Ecotrin 325 daily,  Crestor 40 mg, Lisinopril/HCTZ 20/12.5, Coreg 6.25 b.i.d., Imdur 20  daily, hydralazine 20 mg t.i.d., and Felodipine 10 mg daily.   PHYSICAL EXAMINATION:  GENERAL:  An elderly male in no acute distress.  Ambulates around the clinic slowly without any respiratory difficulty.  VITAL SIGNS:  Blood pressure 94/38 sitting and 98.49 standing. Heart  rate is 58. Apparently, it was in the mid 40's when he saw Dr. Everardo All a  few days ago.  HEENT:  Normal.  NECK:  Supple. There  is no JVD. Carotids are 2+ bilaterally with soft  bruits bilaterally. There is no lymphadenopathy or thyromegaly. No JVD.  CARDIAC:  His PMI is normal. He has distant heart sounds. He has a 2  over 6 holosystolic murmur at the apex.  LUNGS:  Clear with decreased air movement throughout. No wheezes or  rales.  ABDOMEN:  Soft, nontender, and nondistended. There is no  hepatosplenomegaly, no bruits, no masses, good bowel sounds.  EXTREMITIES:  Warm. No clubbing, cyanosis, or edema. No rash.  NEUROLOGIC:  Alert and oriented times three. Cranial nerves 2-12 are  grossly intact. Moves all 4 extremities without difficulty. There is no  pronator drift. Affect is very pleasant.   DIAGNOSTIC STUDIES:  EKG shows sinus bradycardia at a rate of 58 with a  right bundle branch block. No significant STT wave abnormalities.   ASSESSMENT:  1. DIZZINESS:  I suspect this is related to his blood pressure.  However, we need to rule out brady or tachy arrhythmias. Will start      by discontinuing his hydralazine. Put a 48 hour Holter monitor on      him. Also get a 2-D echocardiogram and carotid ultrasound, given      his physical examination findings.  2. PRESUMED CORONARY ARTERY DISEASE:  There is no evidence of ischemia      by history or by recent nuclear study. Continue medical therapy.  3. HYPERLIPIDEMIA:  This is followed by Dr. Everardo All. He is on Crestor.      Given his coronary disease and diabetes, would recommend an LDL of      70.   DISPOSITION:  Will see him back in 3 weeks for routine followup.     Bevelyn Buckles. Bensimhon, MD  Electronically Signed    DRB/MedQ  DD: 07/30/2006  DT: 07/30/2006  Job #: [id number]   cc:   Gregary Signs A. Everardo All, MD

## 2010-07-26 NOTE — Assessment & Plan Note (Signed)
[facility]                            CARDIOLOGY OFFICE NOTE   Danny [name], Danny [name]                     MRN:          [id number]  DATE:08/20/2006                            DOB:          September 10, 1920    REFERRING PHYSICIAN:  Sean A. Everardo All, MD   INTERVAL HISTORY:  Danny [name] is a delightful 77 year old male with  presumed coronary artery disease, which is being managed medically.   His history is also significant for:  1. Hypertension.  2. Diabetes.  3. Hyperlipidemia.  4. COPD with ongoing tobacco use.   He had an echocardiogram in November 2005, which showed an EF 45% to 50%  with anterior and inferior hypokineses.  Adenosine Myoview in January  2006 showed an EF of 48% with a fixed defect in the inferior wall.  There was no ischemia.   He returns today for routine followup.  At his last visit, he was  complaining about a lot of dizziness.  He was somewhat hypotensive at  the time with a blood pressure of 94/38, so we went ahead and stopped  his hydralazine.  He says his dizziness has gotten much better since  that time, but he still has some dizziness when he turns his head  quickly.  He denies any chest pain or shortness of breath.  No  palpitations.  Unfortunately, he continues to smoke almost a pack a day.  He did have a Holter monitor placed, which showed sinus rhythm with  occasional PVCs, and a very brief 4 or 5 beat run of SVT.   CURRENT MEDICATIONS:  Includes:  1. Actos 45-mg tablet 1/2 tablet a day.  2. Enteric coated aspirin 325 a day.  3. Crestor 40 a day.  4. Lisinopril hydrochlorothiazide 20/12.5 b.i.d.  5. Coreg 6.25 b.i.d.  6. Isosorbide nitrate 20 mg a day.  7. Felodipine 10 mg a day.   PHYSICAL EXAMINATION:  He is an elderly, in no acute distress.  Ambulates around the clinic without any respiratory difficulty.  Blood pressure is 150/70.  Heart rate is 56.  Weight is 156.  HEENT:  Normal.  NECK:  Supple.  There  is no JVD.  Carotids are 2+ bilaterally without  any bruits.  There is no thyromegaly or lymphadenopathy.  CARDIAC:  PMI is nondisplaced.  He has a regular rate and rhythm with no  obvious murmurs, rubs, or gallops.  LUNGS:  Clear to auscultation with mildly diminished air movement  throughout.  ABDOMEN:  Soft, non-tender, non-distended.  There is no  hepatosplenomegaly.  No bruits.  No masses appreciated.  EXTREMITIES:  Warm with no cyanosis, clubbing, or edema.  NEUROLOGIC:  He is alert and oriented x3.  Cranial nerves 2 through 12  are intact.  Moves [name] 4 extremities without difficulty.  Affect is  bright.   ACCESSORY DATA:  A repeat echocardiogram Aug 10, 2006 showed an EF of  60% with very mild apical hypokinesis.  There was mild tricuspid  regurgitation, and mild aortic regurgitation.  Carotid ultrasounds  showed a 40% to 59% blockage on the right, and zero to  39% on the left.   ASSESSMENT AND PLAN:  1. Possible coronary artery disease.  His ejection fraction is now      normalized.  No evidence of ischemia on Myoview, or by history.      Continue medical therapy.  2. Hypertension.  Blood pressure is moderately elevated today.  I have      asked him to keep a blood pressure log for [place], and either share      with me or Dr. Everardo [name].  If his blood pressure does remain      elevated, we will need to lower it very slowly due to his dizziness      and previous sensitivity to hydralazine.  3. Dizziness.  I suspect he may have a component of vertigo, which was      exacerbated by his hypotension previously.  This is now stable.  4. Chronic obstructive pulmonary disease with ongoing tobacco use.  I      spent a significant amount of time counseling both him and his wife      on the need to stop smoking as soon as possible.   DISPOSITION:  Return to clinic in 1 year for routine followup.     Danny Buckles. Bensimhon, MD  Electronically Signed    DRB/MedQ  DD: 08/20/2006  DT:  08/21/2006  Job #: [id number]   cc:   Danny Signs A. Everardo All, MD

## 2010-07-28 ENCOUNTER — Other Ambulatory Visit: Payer: Self-pay | Admitting: Endocrinology

## 2010-07-29 NOTE — Op Note (Signed)
NAME:  Danny [name], Danny [name]                        ACCOUNT NO.:  [id number]   MEDICAL RECORD NO.:  [id number]                   PATIENT TYPE:  EMS   LOCATION:  ED                                   FACILITY:  [facility]   PHYSICIAN:  Artist Pais. Mina Marble, M.D.           DATE OF BIRTH:  [birth year]   DATE OF PROCEDURE:  DATE OF DISCHARGE:                                 OPERATIVE REPORT   PREOPERATIVE DIAGNOSIS:  Tip amputation, right thumb with exposed distal  phalangeal bone and volar skin.   POSTOPERATIVE DIAGNOSIS:  Tip amputation, right thumb with exposed distal  phalangeal bone and volar skin.   PROCEDURE:  Revision/amputation with Moberg volar advancement flap with full  thickness skin grafting from the distal forearm crease.   SURGEON:  Artist Pais. Mina Marble, M.D.   ASSISTANT:  None.   ANESTHESIA:  General.   TOURNIQUET TIME:  One hour and ten minutes.   No complications, no drains.   OPERATIVE REPORT:  The patient was taken to the operating room after the  induction of adequate supraclavicular anesthetic.  The right upper extremity  was prepped and draped in the usual sterile fashion.  Once this was done, an  Esmarch __________ limb tourniquet was inflated to 250 mmHg.  At this point  in time, the right thumb was inspected.  It was exposed distal phalangeal  bone, amputation of the nail plate and nail bed down to the level of the  germinal matrix, again with exposed distal phalangeal bone and some loss of  volar skin.  At this point in time, a Moberg flap was fashioned with mid  lateral incisions done on both sides of the digit.  This incision was  carried down through the subcutaneous tissues, transversely across the MP  flexion crease.  The flap was mobilized and advanced distally over the bone.  A nailbed ablation was performed.  The flap was sutured with 5-0 Nylon  distally to cover the osseous defect.  After this was done, the defect  proximally at the MP flexion crease was  covered with a full thickness skin  graft taken from the distal forearm crease and sewn in with 4-0 Chromic and  a tie over 4-0 Nylon bolster over a piece of Xeroform and a mineral soaked  cotton ball.  The skin graft site was closed with a running 3-0 Prolene  subcuticular stitch.  Steri-Strips, 4 x 4s and compressive dressing and a  distal extension block splint was applied to the right upper extremity.  The  patient tolerated the procedure well and went to the recovery room in stable  fashion.                                               Artist Pais Mina Marble, M.D.    MAW/MEDQ  D:  03/28/2003  T:  03/28/2003  Job:  [id number]

## 2010-07-29 NOTE — Consult Note (Signed)
NAME:  Danny [name], Danny [name]                        ACCOUNT NO.:  [id number]   MEDICAL RECORD NO.:  [id number]                   PATIENT TYPE:  EMS   LOCATION:  ED                                   FACILITY:  [facility]   PHYSICIAN:  Artist Pais. Mina Marble, M.D.           DATE OF BIRTH:  15-Nov-1920   DATE OF CONSULTATION:  03/28/2003  DATE OF DISCHARGE:                                   CONSULTATION   PHYSICIAN REQUESTING CONSULTATION:  Dr. Bethann Berkshire   REASON FOR CONSULTATION:  Danny [name] is a very pleasant 77 year old,  right-hand dominant male, who accidentally amputated the tip of his dominant  right thumb earlier today while using a stepladder, presents today with an  amputated distal tip, dominant right thumb.  He is 77 years old.  He has an  allergy to PENICILLIN.  He is currently on Actos, Toprol XL, lisinopril, and  hydrochlorothiazide.  He also takes an aspirin a day.  He smokes a least a  pack a day, no significant alcohol use.   FAMILY HISTORY:  Significant for hypertension.   SOCIAL HISTORY:  As previously stated.   REVIEW OF SYMPTOMS:  Noncontributory.   Examination in the emergency department revealed a well-developed, well-  nourished male, pleasant, alert and oriented x 3.  Examination reveals a tip  amputation of his dominant right thumb.  The amputated part includes the  majority of the nail matrix and nail plate and a segment of volar skin.  He  has avulsed both the ulnar and radial digital nerves, and there is some  ribbon sign on the radial digital artery grossly.  The remaining aspect of  his right hand exam, he has exposed distal phalangeal bone and, again, loss  of volar skin, consistent with his amputation level.  He is neurovascularly  intact otherwise.  X-rays reveal there to be proximal half to two-thirds of  the distal phalanx still present on the proximal part and amputated part has  a small remnant of distal phalangeal bone.   IMPRESSION:  An  77 year old male with an amputated tip of his dominant right  thumb.  I discussed with Mr. Paolini and his family in great detail the  nature of rehabilitation surgery.  Certainly in his age group and being a  smoker, the chances of this being a successful replant are certainly less  than if he was a younger man and a nonsmoker.  Also, of note he has what  appears to be avulsed nerve roots and a ribbon sign, and I explained to the  patient and his family, I think the likelihood of reimplantation is small  and in all likelihood, we would perform either a Moberg or some other local  flap with skeletal shortening.  We will take him to the operating room and  under the microscope examine both the amputated part and the proximal stump  to see if reimplantation is something that  might be feasible but, again, in  all likelihood based on his age and his smoking history, I think it is  unlikely that we will be able to do a reimplantation and will probably  proceed with revision amputation with local flap (Moberg versus V-Y  advancement).  The family understands the risks and benefits.  We will get  him set up as soon as possible and take him to the operating room for  exploration, possible replantation, probable revision amputation with local  flap and skin grafting.                                               Artist Pais Mina Marble, M.D.   MAW/MEDQ  D:  03/28/2003  T:  03/28/2003  Job:  [id number]

## 2010-09-20 ENCOUNTER — Other Ambulatory Visit: Payer: Self-pay | Admitting: Endocrinology

## 2010-12-06 ENCOUNTER — Other Ambulatory Visit (INDEPENDENT_AMBULATORY_CARE_PROVIDER_SITE_OTHER): Payer: Medicare Other

## 2010-12-06 ENCOUNTER — Encounter: Payer: Self-pay | Admitting: Endocrinology

## 2010-12-06 ENCOUNTER — Ambulatory Visit (INDEPENDENT_AMBULATORY_CARE_PROVIDER_SITE_OTHER): Payer: Medicare Other | Admitting: Endocrinology

## 2010-12-06 VITALS — BP 142/58 | HR 55 | Temp 99.0°F | Ht 65.0 in | Wt 147.0 lb

## 2010-12-06 DIAGNOSIS — E1129 Type 2 diabetes mellitus with other diabetic kidney complication: Secondary | ICD-10-CM

## 2010-12-06 DIAGNOSIS — N259 Disorder resulting from impaired renal tubular function, unspecified: Secondary | ICD-10-CM

## 2010-12-06 DIAGNOSIS — Z23 Encounter for immunization: Secondary | ICD-10-CM

## 2010-12-06 DIAGNOSIS — Z125 Encounter for screening for malignant neoplasm of prostate: Secondary | ICD-10-CM

## 2010-12-06 DIAGNOSIS — E785 Hyperlipidemia, unspecified: Secondary | ICD-10-CM

## 2010-12-06 DIAGNOSIS — Z79899 Other long term (current) drug therapy: Secondary | ICD-10-CM

## 2010-12-06 DIAGNOSIS — I10 Essential (primary) hypertension: Secondary | ICD-10-CM

## 2010-12-06 DIAGNOSIS — E119 Type 2 diabetes mellitus without complications: Secondary | ICD-10-CM

## 2010-12-06 DIAGNOSIS — N058 Unspecified nephritic syndrome with other morphologic changes: Secondary | ICD-10-CM

## 2010-12-06 LAB — CBC WITH DIFFERENTIAL/PLATELET
Eosinophils Relative: 3.6 % (ref 0.0–5.0)
HCT: 40.4 % (ref 39.0–52.0)
Hemoglobin: 13.7 g/dL (ref 13.0–17.0)
Lymphocytes Relative: 40 % (ref 12.0–46.0)
Lymphs Abs: 2 10*3/uL (ref 0.7–4.0)
Monocytes Relative: 9 % (ref 3.0–12.0)
Neutro Abs: 2.3 10*3/uL (ref 1.4–7.7)
Platelets: 181 10*3/uL (ref 150.0–400.0)
WBC: 5 10*3/uL (ref 4.5–10.5)

## 2010-12-06 LAB — HEPATIC FUNCTION PANEL
ALT: 15 U/L (ref 0–53)
Alkaline Phosphatase: 65 U/L (ref 39–117)
Bilirubin, Direct: 0.1 mg/dL (ref 0.0–0.3)
Total Protein: 7.7 g/dL (ref 6.0–8.3)

## 2010-12-06 LAB — BASIC METABOLIC PANEL
BUN: 10 mg/dL (ref 6–23)
Chloride: 103 mEq/L (ref 96–112)
Creatinine, Ser: 0.8 mg/dL (ref 0.4–1.5)
Glucose, Bld: 85 mg/dL (ref 70–99)

## 2010-12-06 LAB — LIPID PANEL
Cholesterol: 100 mg/dL (ref 0–200)
HDL: 35.3 mg/dL — ABNORMAL LOW (ref >39.00)
LDL Cholesterol: 50 mg/dL (ref 0–99)
VLDL: 14.6 mg/dL (ref 0.0–40.0)

## 2010-12-06 LAB — IBC PANEL: Saturation Ratios: 38.2 % (ref 20.0–50.0)

## 2010-12-06 LAB — MICROALBUMIN / CREATININE URINE RATIO: Creatinine,U: 196 mg/dL

## 2010-12-06 MED ORDER — CLOTRIMAZOLE-BETAMETHASONE 1-0.05 % EX CREA: TOPICAL_CREAM | CUTANEOUS | Status: DC

## 2010-12-06 NOTE — Patient Instructions (Addendum)
Please schedule a regular physical soon. blood tests are being requested for you today.  please call [phone] to hear your test results.  You will be prompted to enter the 9-digit "MRN" number that appears at the top left of this page, followed by #.  Then you will hear the message. i have sent a prescription to your pharmacy, for a skin cram for your foot.   (update: i left message on phone-tree:  rx as we discussed)

## 2010-12-06 NOTE — Progress Notes (Signed)
Subjective:    Patient ID: Danny [name], male    DOB: [birth year], 77 y.o.   MRN: [id number]  HPI Pt states 6 mos of moderate itching of the right foot, and assoc rash. He stopped klor, due to dizziness Past Medical History  Diagnosis Date  . DIABETES MELLITUS, TYPE II 12/06/2006  . NEPHROPATHY, DIABETIC 12/06/2006  . HYPERLIPIDEMIA 12/06/2006  . HYPOKALEMIA 01/12/2009  . TOBACCO USER 05/13/2009  . HYPERTENSION 12/06/2006  . CORONARY ATHEROSCLEROSIS, NATIVE VESSEL 05/13/2009  . Atrial fibrillation 05/04/2009  . COPD 12/06/2006  . GERD 12/06/2006  . DIVERTICULOSIS, COLON 12/06/2006  . CONSTIPATION 12/20/2007  . RECTAL BLEEDING 12/20/2007  . RENAL INSUFFICIENCY 12/20/2007  . BACK PAIN, LUMBAR 01/21/2008  . DIZZINESS OR VERTIGO 12/06/2006  . ABNORMAL EKG 05/13/2009  . Diastolic dysfunction   . Blindness of right eye     No past surgical history on file.  History   Social History  . Marital Status: Married    Spouse Name: N/A    Number of Children: N/A  . Years of Education: N/A   Occupational History  . Retired    Social History Main Topics  . Smoking status: Current Everyday Smoker  . Smokeless tobacco: Not on file  . Alcohol Use: Not on file  . Drug Use: Not on file  . Sexually Active: Not on file   Other Topics Concern  . Not on file   Social History Narrative  . No narrative on file    Current Outpatient Prescriptions on File Prior to Visit  Medication Sig Dispense Refill  . aspirin 81 MG tablet Take 81 mg by mouth daily.        . CRESTOR 40 MG tablet TAKE 1 TABLET AT BEDTIME  30 tablet  4  . felodipine (PLENDIL) 10 MG 24 hr tablet TAKE 1 TABLET BY MOUTH EVERY DAY  30 tablet  4  . hydrALAZINE (APRESOLINE) 10 MG tablet TAKE 1 TABLET 3 TIMES A DAY  90 tablet  2  . lisinopril (PRINIVIL,ZESTRIL) 10 MG tablet Take 10 mg by mouth daily.        . potassium chloride (KLOR-CON M10) 10 MEQ tablet Take 1 tablet (10 mEq total) by mouth daily.  30 tablet  4    Allergies    Allergen Reactions  . Penicillins     Family History  Problem Relation Age of Onset  . Cancer Neg Hx   . Hypertension Other     Significant for hypertension    BP 142/58  Pulse 55  Temp(Src) 99 F (37.2 C) (Oral)  Ht 5\' 5"  (1.651 m)  Wt 147 lb (66.679 kg)  BMI 24.46 kg/m2  SpO2 97%  Review of Systems He has slightly decreased urinary stream.  denies hypoglycemia    Objective:   Physical Exam Vital signs: see vs page Gen: elderly, frail, no distress Pulses: dorsalis pedis intact bilat.   Feet: no deformity.  no ulcer on the feet.  feet are of normal color and temp.  no edema.  There is bilateral onychomycosis.  There is a moderate eczematous rash on the plantar aspect of the right foot Neuro: sensation is intact to touch on the feet     Lab Results  Component Value Date   WBC 5.0 12/06/2010   HGB 13.7 12/06/2010   HCT 40.4 12/06/2010   PLT 181.0 12/06/2010   CHOL 100 12/06/2010   TRIG 73.0 12/06/2010   HDL 35.30* 12/06/2010   ALT 15 12/06/2010  AST 25 12/06/2010   NA 139 12/06/2010   K 4.3 12/06/2010   CL 103 12/06/2010   CREATININE 0.8 12/06/2010   BUN 10 12/06/2010   CO2 29 12/06/2010   TSH 1.48 12/06/2010   PSA 0.40 12/06/2010   INR 1.1 ratio* 05/04/2009   HGBA1C 5.9 12/06/2010   MICROALBUR 43.4* 12/06/2010      Assessment & Plan:  Rash, prob due to tinea pedis, new Dm, well-controlled, on no med Hypokalemia, no med needed now. Dyslipidemia, well-controlled Htn, with ? Of situational component

## 2010-12-12 LAB — PROTIME-INR
INR: 1.1
Prothrombin Time: 14

## 2010-12-12 LAB — DIFFERENTIAL
Basophils Absolute: 0
Basophils Relative: 0
Monocytes Relative: 13 — ABNORMAL HIGH
Neutro Abs: 4
Neutrophils Relative %: 55

## 2010-12-12 LAB — CBC
HCT: 36.7 — ABNORMAL LOW
MCV: 94.3
RBC: 3.89 — ABNORMAL LOW
WBC: 7.4

## 2010-12-12 LAB — BASIC METABOLIC PANEL
Chloride: 103
GFR calc Af Amer: >60
Potassium: 3.7

## 2010-12-12 LAB — URINALYSIS, ROUTINE W REFLEX MICROSCOPIC
Glucose, UA: NEGATIVE
Hgb urine dipstick: NEGATIVE
Specific Gravity, Urine: 1.018

## 2010-12-12 LAB — OCCULT BLOOD X 1 CARD TO LAB, STOOL: Fecal Occult Bld: NEGATIVE

## 2010-12-23 LAB — CBC
HCT: 40.6
Hemoglobin: 13.9
MCHC: 34.3
MCV: 93.7
Platelets: 208
RBC: 4.33
RDW: 14.4 — ABNORMAL HIGH
WBC: 6

## 2010-12-23 LAB — DIFFERENTIAL
Lymphocytes Relative: 27
Lymphs Abs: 1.6
Neutro Abs: 3.8
Neutrophils Relative %: 64

## 2011-01-10 ENCOUNTER — Ambulatory Visit (INDEPENDENT_AMBULATORY_CARE_PROVIDER_SITE_OTHER): Payer: Medicare Other | Admitting: Endocrinology

## 2011-01-10 ENCOUNTER — Encounter: Payer: Self-pay | Admitting: Endocrinology

## 2011-01-10 DIAGNOSIS — I10 Essential (primary) hypertension: Secondary | ICD-10-CM

## 2011-01-10 DIAGNOSIS — Z0000 Encounter for general adult medical examination without abnormal findings: Secondary | ICD-10-CM

## 2011-01-10 MED ORDER — HYDRALAZINE HCL 25 MG PO TABS: 25.0000 mg | ORAL_TABLET | Freq: Three times a day (TID) | ORAL | Status: DC

## 2011-01-10 NOTE — Patient Instructions (Addendum)
Increase hydralazine to 25 mg 3x a day. please consider these measures for your health:  minimize alcohol.  do not use tobacco products.  have a colonoscopy at least every 10 years from age 77.   keep firearms safely stored.  always use seat belts.  have working smoke alarms in your home.  see an eye doctor and dentist regularly.  never drive under the influence of alcohol or drugs (including prescription drugs).   please let me know what your wishes would be, if artificial life support measures should become necessary.  it is critically important to prevent falling down (keep floor areas well-lit, dry, and free of loose objects.  If you have a cane, walker, or wheelchair, you should use it, even for short trips around the house.  Also, try not to rush). Please come back for a follow-up appointment in 6 months.  You should have a vaccine against shingles (a painful rash which results from the  chickenpox infection which most people had many years ago).  This vaccine reduces, but does not totally eliminate the risk of shingles.  Because this is a medicare part d benefit, there are 3 ways you can get it:  You can go to a pharmacy and get the injection (I can give you a prescription), or I can give you a prescription to have filled at a pharmacy, and bring back here for [place] to give.  The other option is that you can pay up-front for it, and we'll give you a form to make a claim for reimbursement from your medicare part d carrier.

## 2011-01-10 NOTE — Progress Notes (Signed)
Subjective:    Patient ID: Danny [name], male    DOB: 1920/09/09, 77 y.o.   MRN: [id number]  HPI here for regular wellness examination.  He's feeling pretty well in general, and says chronic med probs are stable, except as noted below Past Medical History  Diagnosis Date  . DIABETES MELLITUS, TYPE II 12/06/2006  . NEPHROPATHY, DIABETIC 12/06/2006  . HYPERLIPIDEMIA 12/06/2006  . HYPOKALEMIA 01/12/2009  . TOBACCO USER 05/13/2009  . HYPERTENSION 12/06/2006  . CORONARY ATHEROSCLEROSIS, NATIVE VESSEL 05/13/2009  . Atrial fibrillation 05/04/2009  . COPD 12/06/2006  . GERD 12/06/2006  . DIVERTICULOSIS, COLON 12/06/2006  . CONSTIPATION 12/20/2007  . RECTAL BLEEDING 12/20/2007  . RENAL INSUFFICIENCY 12/20/2007  . BACK PAIN, LUMBAR 01/21/2008  . DIZZINESS OR VERTIGO 12/06/2006  . ABNORMAL EKG 05/13/2009  . Diastolic dysfunction   . Blindness of right eye     No past surgical history on file.  History   Social History  . Marital Status: Married    Spouse Name: N/A    Number of Children: N/A  . Years of Education: N/A   Occupational History  . Retired    Social History Main Topics  . Smoking status: Current Everyday Smoker  . Smokeless tobacco: Not on file  . Alcohol Use: Not on file  . Drug Use: Not on file  . Sexually Active: Not on file   Other Topics Concern  . Not on file   Social History Narrative  . No narrative on file    Current Outpatient Prescriptions on File Prior to Visit  Medication Sig Dispense Refill  . aspirin 81 MG tablet Take 81 mg by mouth daily.        . clotrimazole-betamethasone (LOTRISONE) cream Apply to your right foot 3x a day, as needed for itching or rash  30 g  2  . CRESTOR 40 MG tablet TAKE 1 TABLET AT BEDTIME  30 tablet  4  . felodipine (PLENDIL) 10 MG 24 hr tablet TAKE 1 TABLET BY MOUTH EVERY DAY  30 tablet  4    Allergies  Allergen Reactions  . Penicillins     Family History  Problem Relation Age of Onset  . Cancer Neg Hx   . Hypertension  Other     Significant for hypertension    BP 162/60  Pulse 58  Resp 14  Ht 5\' 7"  (1.702 m)  Wt 145 lb 8 oz (65.998 kg)  BMI 22.79 kg/m2  SpO2 98%     Review of Systems  Constitutional: Negative for fever.  HENT: Negative for hearing loss.   Eyes:       No visual loss (right eye is prosthetic)  Respiratory: Negative for shortness of breath.   Cardiovascular: Negative for chest pain.  Gastrointestinal: Negative for anal bleeding.  Genitourinary: Negative for hematuria.  Musculoskeletal: Negative for gait problem.  Skin: Negative for rash.  Neurological: Negative for numbness.  Hematological: Bruises/bleeds easily.  Psychiatric/Behavioral: Negative for dysphoric mood.       Objective:   Physical Exam VS: see vs page GEN: no distress HEAD: head: no deformity eyes: no periorbital swelling, no proptosis external nose and ears are normal mouth: no lesion seen NECK: supple, thyroid is not enlarged CHEST WALL: no deformity LUNGS: clear to auscultation BREASTS:  No gynecomastia CV: reg rate and rhythm.  There is a systolic murmur ABD: abdomen is soft, nontender.  no hepatosplenomegaly.  not distended.  no hernia MUSCULOSKELETAL: muscle bulk and strength are grossly normal.  no obvious joint swelling.  gait is normal and steady EXTEMITIES: no deformity.  no ulcer on the feet.  feet are of normal color and temp. There is bilateral onychomycosis, and varicosities. PULSES: dorsalis pedis intact bilat.  no carotid bruit NEURO:  cn 2-12 grossly intact.   readily moves all 4's.  sensation is intact to touch on the feet SKIN:  Normal texture and temperature.  No rash or suspicious lesion is visible.   NODES:  None palpable at the neck PSYCH: alert, oriented x3.  Does not appear anxious nor depressed.       Assessment & Plan:  Wellness visit today, with problems stable, except as noted.    SEPARATE EVALUATION FOLLOWS--EACH PROBLEM HERE IS NEW, NOT RESPONDING TO TREATMENT,  OR POSES SIGNIFICANT RISK TO THE PATIENT'S HEALTH: Pt takes bp meds as rx'ed.  Denies weight change HISTORY OF THE PRESENT ILLNESS: PAST MEDICAL HISTORY reviewed and up to date today REVIEW OF SYSTEMS: Denies syncope PHYSICAL EXAMINATION: VITAL SIGNS:  See vs page GENERAL: no distress Ext no edema LAB/XRAY RESULTS: i reviewed most recent bmet IMPRESSION: Htn, needs increased rx PLAN: See instruction page

## 2011-05-16 ENCOUNTER — Other Ambulatory Visit: Payer: Self-pay | Admitting: Endocrinology

## 2011-09-08 ENCOUNTER — Telehealth: Payer: Self-pay

## 2011-09-08 NOTE — Telephone Encounter (Signed)
NP called requesting clarification on pt's medication. NP also checked pt urine at his home and it was positive for +2 protein.

## 2011-09-08 NOTE — Telephone Encounter (Signed)
Left message on machine for pt to return my call  

## 2011-11-29 ENCOUNTER — Ambulatory Visit (INDEPENDENT_AMBULATORY_CARE_PROVIDER_SITE_OTHER): Payer: Medicare Other

## 2011-11-29 DIAGNOSIS — Z23 Encounter for immunization: Secondary | ICD-10-CM

## 2012-01-11 ENCOUNTER — Encounter: Payer: Self-pay | Admitting: Endocrinology

## 2012-01-11 ENCOUNTER — Ambulatory Visit (INDEPENDENT_AMBULATORY_CARE_PROVIDER_SITE_OTHER): Payer: Medicare Other | Admitting: Endocrinology

## 2012-01-11 VITALS — BP 122/82 | HR 66 | Temp 98.6°F | Wt 134.0 lb

## 2012-01-11 DIAGNOSIS — N259 Disorder resulting from impaired renal tubular function, unspecified: Secondary | ICD-10-CM

## 2012-01-11 DIAGNOSIS — I10 Essential (primary) hypertension: Secondary | ICD-10-CM

## 2012-01-11 DIAGNOSIS — R8281 Pyuria: Secondary | ICD-10-CM

## 2012-01-11 DIAGNOSIS — E119 Type 2 diabetes mellitus without complications: Secondary | ICD-10-CM

## 2012-01-11 DIAGNOSIS — Z125 Encounter for screening for malignant neoplasm of prostate: Secondary | ICD-10-CM

## 2012-01-11 DIAGNOSIS — E785 Hyperlipidemia, unspecified: Secondary | ICD-10-CM

## 2012-01-11 DIAGNOSIS — Z0000 Encounter for general adult medical examination without abnormal findings: Secondary | ICD-10-CM

## 2012-01-11 DIAGNOSIS — Z79899 Other long term (current) drug therapy: Secondary | ICD-10-CM

## 2012-01-11 DIAGNOSIS — R82998 Other abnormal findings in urine: Secondary | ICD-10-CM

## 2012-01-11 DIAGNOSIS — I4891 Unspecified atrial fibrillation: Secondary | ICD-10-CM

## 2012-01-11 LAB — HEMOGLOBIN A1C
Hgb A1c MFr Bld: 5.7 % — ABNORMAL HIGH (ref <5.7)
Mean Plasma Glucose: 117 mg/dL — ABNORMAL HIGH (ref <117)

## 2012-01-11 LAB — CBC WITH DIFFERENTIAL/PLATELET
Basophils Relative: 1 % (ref 0–1)
Eosinophils Absolute: 0.2 10*3/uL (ref 0.0–0.7)
Hemoglobin: 12.7 g/dL — ABNORMAL LOW (ref 13.0–17.0)
MCH: 32.7 pg (ref 26.0–34.0)
MCHC: 35.1 g/dL (ref 30.0–36.0)
Monocytes Relative: 11 % (ref 3–12)
Neutrophils Relative %: 48 % (ref 43–77)

## 2012-01-11 LAB — HEPATIC FUNCTION PANEL
ALT: 11 U/L (ref 0–53)
Albumin: 3.7 g/dL (ref 3.5–5.2)
Total Protein: 7.2 g/dL (ref 6.0–8.3)

## 2012-01-11 LAB — LIPID PANEL
LDL Cholesterol: 74 mg/dL (ref 0–99)
Total CHOL/HDL Ratio: 3.6 Ratio
VLDL: 21 mg/dL (ref 0–40)

## 2012-01-11 LAB — BASIC METABOLIC PANEL
BUN: 15 mg/dL (ref 6–23)
Calcium: 9.4 mg/dL (ref 8.4–10.5)
Creat: 0.94 mg/dL (ref 0.50–1.35)

## 2012-01-11 NOTE — Progress Notes (Signed)
  Subjective:    Patient ID: Danny [name], male    DOB: May 19, 1920, 78 y.o.   MRN: [id number]  HPI    Review of Systems  Constitutional: Negative for fever and unexpected weight change.  HENT: Negative for hearing loss.   Eyes: Negative for visual disturbance.  Respiratory: Negative for cough.   Cardiovascular: Negative for chest pain.  Gastrointestinal: Negative for abdominal pain.  Genitourinary: Negative for hematuria and difficulty urinating.  Musculoskeletal: Negative for back pain.  Skin: Negative for rash.  Hematological: Does not bruise/bleed easily.  Psychiatric/Behavioral: Negative for dysphoric mood.       Objective:   Physical Exam VS: see vs page GEN: no distress HEAD: head: no deformity eyes: no periorbital swelling, no proptosis external nose and ears are normal mouth: no lesion seen NECK: supple, thyroid is not enlarged CHEST WALL: no deformity LUNGS:  Clear to auscultation CV: reg rate and rhythm, no murmur ABD: abdomen is soft, nontender.  no hepatosplenomegaly.  not distended.  no hernia GENITALIA/RECTAL: declined MUSCULOSKELETAL: muscle bulk and strength are grossly normal.  no obvious joint swelling.  gait is normal and steady EXTEMITIES: declined PULSES: dorsalis pedis intact bilat.  no carotid bruit NEURO:  cn 2-12 grossly intact.   readily moves all 4's.  sensation is intact to touch on the feet SKIN:  Normal texture and temperature.  No rash or suspicious lesion is visible.   NODES:  None palpable at the neck PSYCH: alert, oriented x3.  Does not appear anxious nor depressed.        Assessment & Plan:      SEPARATE EVALUATION FOLLOWS--EACH PROBLEM HERE IS NEW, NOT RESPONDING TO TREATMENT, OR POSES SIGNIFICANT RISK TO THE PATIENT'S HEALTH: HISTORY OF THE PRESENT ILLNESS:  PAST MEDICAL HISTORY reviewed and up to date today REVIEW OF SYSTEMS: PHYSICAL EXAMINATION: VITAL SIGNS:  See vs page GENERAL: no distress LAB/XRAY  RESULTS: IMPRESSION: PLAN: See instruction page

## 2012-01-11 NOTE — Patient Instructions (Addendum)
please consider these measures for your health:  minimize alcohol.  do not use tobacco products.  have a colonoscopy at least every 10 years from age 79.  keep firearms safely stored.  always use seat belts.  have working smoke alarms in your home.  see an eye doctor and dentist regularly.  never drive under the influence of alcohol or drugs (including prescription drugs).   please let me know what your wishes would be, if artificial life support measures should become necessary.  it is critically important to prevent falling down (keep floor areas well-lit, dry, and free of loose objects.  If you have a cane, walker, or wheelchair, you should use it, even for short trips around the house.  Also, try not to rush). Please come back for a follow-up appointment in 6 months blood tests are being requested for you today.  You will be contacted with results. You should have a vaccine against shingles (a painful rash which results from the  chickenpox infection which most people had many years ago).  This vaccine reduces, but does not totally eliminate the risk of shingles.  Because this is a medicare part d benefit, you should get it at a pharmacy.

## 2012-01-12 LAB — URINALYSIS, ROUTINE W REFLEX MICROSCOPIC
Bilirubin Urine: NEGATIVE
Glucose, UA: NEGATIVE mg/dL
Hgb urine dipstick: NEGATIVE
Leukocytes, UA: NEGATIVE
Protein, ur: 100 mg/dL — AB

## 2012-01-12 LAB — PTH, INTACT AND CALCIUM: PTH: 67.6 pg/mL (ref 14.0–72.0)

## 2012-01-12 LAB — MICROALBUMIN / CREATININE URINE RATIO
Creatinine, Urine: 223.5 mg/dL
Microalb, Ur: 61.21 mg/dL — ABNORMAL HIGH (ref 0.00–1.89)

## 2012-01-12 LAB — PSA: PSA: 0.46 ng/mL (ref <=4.00)

## 2012-01-12 LAB — URINALYSIS, MICROSCOPIC ONLY: Bacteria, UA: NONE SEEN

## 2012-01-14 DIAGNOSIS — R8281 Pyuria: Secondary | ICD-10-CM | POA: Insufficient documentation

## 2012-01-14 DIAGNOSIS — Z0000 Encounter for general adult medical examination without abnormal findings: Secondary | ICD-10-CM | POA: Insufficient documentation

## 2012-02-06 ENCOUNTER — Encounter: Payer: Self-pay | Admitting: Endocrinology

## 2012-04-30 ENCOUNTER — Other Ambulatory Visit: Payer: Self-pay | Admitting: Neurology

## 2012-04-30 MED ORDER — ROSUVASTATIN CALCIUM 40 MG PO TABS: 40.0000 mg | ORAL_TABLET | Freq: Every day | ORAL | Status: DC

## 2012-08-08 ENCOUNTER — Telehealth: Payer: Self-pay | Admitting: Endocrinology

## 2012-11-25 ENCOUNTER — Encounter: Payer: Self-pay | Admitting: Endocrinology

## 2012-11-25 ENCOUNTER — Ambulatory Visit (INDEPENDENT_AMBULATORY_CARE_PROVIDER_SITE_OTHER): Payer: Medicare Other | Admitting: Endocrinology

## 2012-11-25 VITALS — BP 126/70 | HR 75 | Ht 68.0 in | Wt 132.0 lb

## 2012-11-25 DIAGNOSIS — Z0000 Encounter for general adult medical examination without abnormal findings: Secondary | ICD-10-CM

## 2012-11-25 DIAGNOSIS — I10 Essential (primary) hypertension: Secondary | ICD-10-CM

## 2012-11-25 DIAGNOSIS — Z23 Encounter for immunization: Secondary | ICD-10-CM

## 2012-11-25 NOTE — Patient Instructions (Addendum)
Please come in for a regular physical after 01/10/13. Please continue the same medications.

## 2012-11-25 NOTE — Progress Notes (Signed)
  Subjective:    Patient ID: Danny [name], male    DOB: [birth year], 79 y.o.   MRN: [id number]  HPI The state of at least three ongoing medical problems is addressed today, with interval history of each noted here: HTN: he denies chest pain COPD: he denies sob AF: he denies palpitations.  Past Medical History  Diagnosis Date  . DIABETES MELLITUS, TYPE II 12/06/2006  . NEPHROPATHY, DIABETIC 12/06/2006  . HYPERLIPIDEMIA 12/06/2006  . HYPOKALEMIA 01/12/2009  . TOBACCO USER 05/13/2009  . HYPERTENSION 12/06/2006  . CORONARY ATHEROSCLEROSIS, NATIVE VESSEL 05/13/2009  . Atrial fibrillation 05/04/2009  . COPD 12/06/2006  . GERD 12/06/2006  . DIVERTICULOSIS, COLON 12/06/2006  . CONSTIPATION 12/20/2007  . RECTAL BLEEDING 12/20/2007  . RENAL INSUFFICIENCY 12/20/2007  . BACK PAIN, LUMBAR 01/21/2008  . DIZZINESS OR VERTIGO 12/06/2006  . ABNORMAL EKG 05/13/2009  . Diastolic dysfunction   . Blindness of right eye     No past surgical history on file.  History   Social History  . Marital Status: Married    Spouse Name: N/A    Number of Children: N/A  . Years of Education: N/A   Occupational History  . Retired    Social History Main Topics  . Smoking status: Current Every Day Smoker  . Smokeless tobacco: Not on file  . Alcohol Use: Not on file  . Drug Use: Not on file  . Sexual Activity: Not on file   Other Topics Concern  . Not on file   Social History Narrative  . No narrative on file    Current Outpatient Prescriptions on File Prior to Visit  Medication Sig Dispense Refill  . aspirin 81 MG tablet Take 81 mg by mouth daily.        . clotrimazole-betamethasone (LOTRISONE) cream Apply to your right foot 3x a day, as needed for itching or rash  30 g  2  . felodipine (PLENDIL) 10 MG 24 hr tablet TAKE 1 TABLET BY MOUTH EVERY DAY  30 tablet  4  . rosuvastatin (CRESTOR) 40 MG tablet Take 1 tablet (40 mg total) by mouth at bedtime.  30 tablet  3  . hydrALAZINE (APRESOLINE) 25 MG tablet Take 1  tablet (25 mg total) by mouth 3 (three) times daily.  90 tablet  11   No current facility-administered medications on file prior to visit.    Allergies  Allergen Reactions  . Penicillins     Family History  Problem Relation Age of Onset  . Cancer Neg Hx   . Hypertension Other     Significant for hypertension    BP 126/70  Pulse 75  Ht 5\' 8"  (1.727 m)  Wt 132 lb (59.875 kg)  BMI 20.08 kg/m2  SpO2 97%  Review of Systems Denies memory loss. He has lost a few lbs.    Objective:   Physical Exam VITAL SIGNS:  See vs page GENERAL: no distress LUNGS:  Clear to auscultation HEART:  Regular rate and rhythm without murmurs noted. Normal S1,S2.     i reviewed electrocardiogram    Assessment & Plan:  HTN: well-controlled AF, resolved. Copd: better, prob due to weight loss

## 2012-11-26 NOTE — Progress Notes (Signed)
  Subjective:    Patient ID: Danny [name], male    DOB: 09/15/1920, 79 y.o.   MRN: [id number]  HPI x   Review of Systems     Objective:   Physical Exam        Assessment & Plan:

## 2013-05-11 ENCOUNTER — Encounter (HOSPITAL_COMMUNITY): Payer: Self-pay | Admitting: Emergency Medicine

## 2013-05-11 ENCOUNTER — Inpatient Hospital Stay (HOSPITAL_COMMUNITY)
Admission: EM | Admit: 2013-05-11 | Discharge: 2013-05-11 | DRG: 293 | Discharge status: Against Medical Advice | Payer: Medicare Other | Attending: Internal Medicine | Admitting: Internal Medicine

## 2013-05-11 ENCOUNTER — Emergency Department (HOSPITAL_COMMUNITY): Payer: Medicare Other

## 2013-05-11 DIAGNOSIS — R778 Other specified abnormalities of plasma proteins: Secondary | ICD-10-CM

## 2013-05-11 DIAGNOSIS — Z8249 Family history of ischemic heart disease and other diseases of the circulatory system: Secondary | ICD-10-CM

## 2013-05-11 DIAGNOSIS — Z833 Family history of diabetes mellitus: Secondary | ICD-10-CM

## 2013-05-11 DIAGNOSIS — F17200 Nicotine dependence, unspecified, uncomplicated: Secondary | ICD-10-CM | POA: Diagnosis present

## 2013-05-11 DIAGNOSIS — Z7982 Long term (current) use of aspirin: Secondary | ICD-10-CM

## 2013-05-11 DIAGNOSIS — I509 Heart failure, unspecified: Secondary | ICD-10-CM | POA: Diagnosis present

## 2013-05-11 DIAGNOSIS — E1149 Type 2 diabetes mellitus with other diabetic neurological complication: Secondary | ICD-10-CM | POA: Diagnosis present

## 2013-05-11 DIAGNOSIS — J4489 Other specified chronic obstructive pulmonary disease: Secondary | ICD-10-CM | POA: Diagnosis present

## 2013-05-11 DIAGNOSIS — J811 Chronic pulmonary edema: Secondary | ICD-10-CM | POA: Diagnosis present

## 2013-05-11 DIAGNOSIS — E1142 Type 2 diabetes mellitus with diabetic polyneuropathy: Secondary | ICD-10-CM | POA: Diagnosis present

## 2013-05-11 DIAGNOSIS — I359 Nonrheumatic aortic valve disorder, unspecified: Secondary | ICD-10-CM

## 2013-05-11 DIAGNOSIS — IMO0001 Reserved for inherently not codable concepts without codable children: Secondary | ICD-10-CM | POA: Diagnosis present

## 2013-05-11 DIAGNOSIS — E785 Hyperlipidemia, unspecified: Secondary | ICD-10-CM | POA: Diagnosis present

## 2013-05-11 DIAGNOSIS — R799 Abnormal finding of blood chemistry, unspecified: Secondary | ICD-10-CM

## 2013-05-11 DIAGNOSIS — H5440 Blindness, one eye, unspecified eye: Secondary | ICD-10-CM | POA: Diagnosis present

## 2013-05-11 DIAGNOSIS — I2510 Atherosclerotic heart disease of native coronary artery without angina pectoris: Secondary | ICD-10-CM | POA: Diagnosis present

## 2013-05-11 DIAGNOSIS — R7989 Other specified abnormal findings of blood chemistry: Secondary | ICD-10-CM | POA: Diagnosis present

## 2013-05-11 DIAGNOSIS — I10 Essential (primary) hypertension: Secondary | ICD-10-CM | POA: Diagnosis present

## 2013-05-11 DIAGNOSIS — I5031 Acute diastolic (congestive) heart failure: Principal | ICD-10-CM | POA: Diagnosis present

## 2013-05-11 DIAGNOSIS — J449 Chronic obstructive pulmonary disease, unspecified: Secondary | ICD-10-CM

## 2013-05-11 DIAGNOSIS — K219 Gastro-esophageal reflux disease without esophagitis: Secondary | ICD-10-CM | POA: Diagnosis present

## 2013-05-11 LAB — CBC
HEMATOCRIT: 39.3 % (ref 39.0–52.0)
HEMOGLOBIN: 13.9 g/dL (ref 13.0–17.0)
MCH: 33.1 pg (ref 26.0–34.0)
MCHC: 35.4 g/dL (ref 30.0–36.0)
MCV: 93.6 fL (ref 78.0–100.0)
Platelets: 237 10*3/uL (ref 150–400)
RBC: 4.2 MIL/uL — ABNORMAL LOW (ref 4.22–5.81)
RDW: 13.4 % (ref 11.5–15.5)
WBC: 4.9 10*3/uL (ref 4.0–10.5)

## 2013-05-11 LAB — URINALYSIS, ROUTINE W REFLEX MICROSCOPIC
Bilirubin Urine: NEGATIVE
Glucose, UA: NEGATIVE mg/dL
Hgb urine dipstick: NEGATIVE
Ketones, ur: NEGATIVE mg/dL
LEUKOCYTES UA: NEGATIVE
Nitrite: NEGATIVE
PROTEIN: 100 mg/dL — AB
SPECIFIC GRAVITY, URINE: 1.011 (ref 1.005–1.030)
UROBILINOGEN UA: 0.2 mg/dL (ref 0.0–1.0)
pH: 7 (ref 5.0–8.0)

## 2013-05-11 LAB — COMPREHENSIVE METABOLIC PANEL
ALBUMIN: 3.7 g/dL (ref 3.5–5.2)
ALT: 9 U/L (ref 0–53)
ALT: 8 U/L (ref 0–53)
AST: 24 U/L (ref 0–37)
AST: 22 U/L (ref 0–37)
Albumin: 3.7 g/dL (ref 3.5–5.2)
Alkaline Phosphatase: 72 U/L (ref 39–117)
Alkaline Phosphatase: 75 U/L (ref 39–117)
BILIRUBIN TOTAL: 0.7 mg/dL (ref 0.3–1.2)
BUN: 13 mg/dL (ref 6–23)
BUN: 14 mg/dL (ref 6–23)
CALCIUM: 9.6 mg/dL (ref 8.4–10.5)
CHLORIDE: 95 meq/L — AB (ref 96–112)
CO2: 30 mEq/L (ref 19–32)
CO2: 27 meq/L (ref 19–32)
CREATININE: 0.89 mg/dL (ref 0.50–1.35)
Calcium: 9.8 mg/dL (ref 8.4–10.5)
Chloride: 95 mEq/L — ABNORMAL LOW (ref 96–112)
Creatinine, Ser: 0.93 mg/dL (ref 0.50–1.35)
GFR calc Af Amer: 85 mL/min — ABNORMAL LOW (ref >90)
GFR calc non Af Amer: 72 mL/min — ABNORMAL LOW (ref >90)
GFR, EST AFRICAN AMERICAN: 83 mL/min — AB (ref >90)
GFR, EST NON AFRICAN AMERICAN: 73 mL/min — AB (ref >90)
GLUCOSE: 145 mg/dL — AB (ref 70–99)
GLUCOSE: 155 mg/dL — AB (ref 70–99)
Potassium: 4.3 mEq/L (ref 3.7–5.3)
Potassium: 4 mEq/L (ref 3.7–5.3)
SODIUM: 135 meq/L — AB (ref 137–147)
Sodium: 135 mEq/L — ABNORMAL LOW (ref 137–147)
TOTAL PROTEIN: 8.5 g/dL — AB (ref 6.0–8.3)
Total Bilirubin: 0.5 mg/dL (ref 0.3–1.2)
Total Protein: 8.8 g/dL — ABNORMAL HIGH (ref 6.0–8.3)

## 2013-05-11 LAB — GLUCOSE, CAPILLARY
Glucose-Capillary: 143 mg/dL — ABNORMAL HIGH (ref 70–99)
Glucose-Capillary: 192 mg/dL — ABNORMAL HIGH (ref 70–99)
Glucose-Capillary: 151 mg/dL — ABNORMAL HIGH (ref 70–99)

## 2013-05-11 LAB — CBC WITH DIFFERENTIAL/PLATELET
BASOS PCT: 0 % (ref 0–1)
Basophils Absolute: 0 10*3/uL (ref 0.0–0.1)
EOS ABS: 0.2 10*3/uL (ref 0.0–0.7)
EOS PCT: 3 % (ref 0–5)
HCT: 40.3 % (ref 39.0–52.0)
Hemoglobin: 13.9 g/dL (ref 13.0–17.0)
LYMPHS ABS: 0.7 10*3/uL (ref 0.7–4.0)
Lymphocytes Relative: 14 % (ref 12–46)
MCH: 32.9 pg (ref 26.0–34.0)
MCHC: 34.5 g/dL (ref 30.0–36.0)
MCV: 95.3 fL (ref 78.0–100.0)
Monocytes Absolute: 0.6 10*3/uL (ref 0.1–1.0)
Monocytes Relative: 11 % (ref 3–12)
NEUTROS PCT: 71 % (ref 43–77)
Neutro Abs: 3.7 10*3/uL (ref 1.7–7.7)
PLATELETS: 225 10*3/uL (ref 150–400)
RBC: 4.23 MIL/uL (ref 4.22–5.81)
RDW: 13.5 % (ref 11.5–15.5)
WBC: 5.2 10*3/uL (ref 4.0–10.5)

## 2013-05-11 LAB — URINE MICROSCOPIC-ADD ON

## 2013-05-11 LAB — HEMOGLOBIN A1C
Hgb A1c MFr Bld: 5.9 % — ABNORMAL HIGH (ref <5.7)
Mean Plasma Glucose: 123 mg/dL — ABNORMAL HIGH (ref <117)

## 2013-05-11 LAB — TROPONIN I
TROPONIN I: 0.31 ng/mL — AB (ref <0.30)
TROPONIN I: 0.46 ng/mL — AB (ref <0.30)
Troponin I: 0.39 ng/mL (ref <0.30)

## 2013-05-11 LAB — MAGNESIUM: Magnesium: 1.9 mg/dL (ref 1.5–2.5)

## 2013-05-11 LAB — TSH: TSH: 1.51 u[IU]/mL (ref 0.350–4.500)

## 2013-05-11 LAB — PHOSPHORUS: Phosphorus: 3.6 mg/dL (ref 2.3–4.6)

## 2013-05-11 LAB — PRO B NATRIURETIC PEPTIDE: Pro B Natriuretic peptide (BNP): 4546 pg/mL — ABNORMAL HIGH (ref 0–450)

## 2013-05-11 MED ORDER — SODIUM CHLORIDE 0.9 % IJ SOLN: 3.0000 mL | Freq: Two times a day (BID) | INTRAMUSCULAR | Status: DC

## 2013-05-11 MED ORDER — METHYLPREDNISOLONE SODIUM SUCC 125 MG IJ SOLR
125.0000 mg | Freq: Once | INTRAMUSCULAR | Status: AC
  Administered 2013-05-11: 125 mg via INTRAVENOUS
  Filled 2013-05-11: qty 2

## 2013-05-11 MED ORDER — SODIUM CHLORIDE 0.9 % IV SOLN: 250.0000 mL | INTRAVENOUS | Status: DC | PRN

## 2013-05-11 MED ORDER — GUAIFENESIN ER 600 MG PO TB12
600.0000 mg | ORAL_TABLET | Freq: Two times a day (BID) | ORAL | Status: DC
  Administered 2013-05-11: 600 mg via ORAL
  Filled 2013-05-11 (×2): qty 1

## 2013-05-11 MED ORDER — SODIUM CHLORIDE 0.9 % IJ SOLN: 3.0000 mL | INTRAMUSCULAR | Status: DC | PRN

## 2013-05-11 MED ORDER — ASPIRIN 81 MG PO CHEW: 81.0000 mg | CHEWABLE_TABLET | Freq: Every day | ORAL | Status: DC

## 2013-05-11 MED ORDER — ENOXAPARIN SODIUM 40 MG/0.4ML ~~LOC~~ SOLN
40.0000 mg | SUBCUTANEOUS | Status: DC
  Administered 2013-05-11: 40 mg via SUBCUTANEOUS
  Filled 2013-05-11: qty 0.4

## 2013-05-11 MED ORDER — SODIUM CHLORIDE 0.9 % IJ SOLN
3.0000 mL | Freq: Two times a day (BID) | INTRAMUSCULAR | Status: DC
  Administered 2013-05-11: 3 mL via INTRAVENOUS

## 2013-05-11 MED ORDER — INSULIN ASPART 100 UNIT/ML ~~LOC~~ SOLN: 0.0000 [IU] | SUBCUTANEOUS | Status: DC

## 2013-05-11 MED ORDER — RAMIPRIL 1.25 MG PO CAPS
1.2500 mg | ORAL_CAPSULE | Freq: Two times a day (BID) | ORAL | Status: DC
  Administered 2013-05-11: 1.25 mg via ORAL
  Filled 2013-05-11 (×2): qty 1

## 2013-05-11 MED ORDER — DOCUSATE SODIUM 100 MG PO CAPS
100.0000 mg | ORAL_CAPSULE | Freq: Two times a day (BID) | ORAL | Status: DC
  Administered 2013-05-11: 100 mg via ORAL
  Filled 2013-05-11 (×2): qty 1

## 2013-05-11 MED ORDER — LORAZEPAM 0.5 MG PO TABS
0.2500 mg | ORAL_TABLET | Freq: Once | ORAL | Status: AC
  Administered 2013-05-11: 0.25 mg via ORAL
  Filled 2013-05-11: qty 1

## 2013-05-11 MED ORDER — FUROSEMIDE 20 MG PO TABS: 20.0000 mg | ORAL_TABLET | Freq: Every day | ORAL | Status: DC

## 2013-05-11 MED ORDER — IPRATROPIUM BROMIDE 0.02 % IN SOLN
0.5000 mg | Freq: Four times a day (QID) | RESPIRATORY_TRACT | Status: DC
  Administered 2013-05-11 (×2): 0.5 mg via RESPIRATORY_TRACT
  Filled 2013-05-11 (×2): qty 2.5

## 2013-05-11 MED ORDER — FUROSEMIDE 10 MG/ML IJ SOLN
40.0000 mg | Freq: Two times a day (BID) | INTRAMUSCULAR | Status: DC
  Administered 2013-05-11 (×2): 40 mg via INTRAVENOUS
  Filled 2013-05-11 (×3): qty 4

## 2013-05-11 MED ORDER — HYDROCODONE-ACETAMINOPHEN 5-325 MG PO TABS: 1.0000 | ORAL_TABLET | ORAL | Status: DC | PRN

## 2013-05-11 MED ORDER — ASPIRIN EC 325 MG PO TBEC
325.0000 mg | DELAYED_RELEASE_TABLET | Freq: Once | ORAL | Status: AC
  Administered 2013-05-11: 325 mg via ORAL
  Filled 2013-05-11 (×2): qty 1

## 2013-05-11 MED ORDER — FUROSEMIDE 10 MG/ML IJ SOLN
40.0000 mg | Freq: Once | INTRAMUSCULAR | Status: AC
  Administered 2013-05-11: 40 mg via INTRAVENOUS
  Filled 2013-05-11: qty 4

## 2013-05-11 MED ORDER — ALBUTEROL SULFATE (2.5 MG/3ML) 0.083% IN NEBU: 5.0000 mg | INHALATION_SOLUTION | Freq: Once | RESPIRATORY_TRACT | Status: DC

## 2013-05-11 MED ORDER — ACETAMINOPHEN 325 MG PO TABS: 650.0000 mg | ORAL_TABLET | Freq: Four times a day (QID) | ORAL | Status: DC | PRN

## 2013-05-11 MED ORDER — ONDANSETRON HCL 4 MG/2ML IJ SOLN: 4.0000 mg | Freq: Four times a day (QID) | INTRAMUSCULAR | Status: DC | PRN

## 2013-05-11 MED ORDER — ACETAMINOPHEN 650 MG RE SUPP: 650.0000 mg | Freq: Four times a day (QID) | RECTAL | Status: DC | PRN

## 2013-05-11 MED ORDER — ONDANSETRON HCL 4 MG PO TABS: 4.0000 mg | ORAL_TABLET | Freq: Four times a day (QID) | ORAL | Status: DC | PRN

## 2013-05-11 NOTE — Progress Notes (Signed)
Patient has decided to sign out AMA. Patient states "I want to go home to be with my wife." He hasn't been hospitalized before. Danny [name] aware of acute diastolic CHF. Pt not medically ready for d/c, but there seems no other option than to let him sign out AMA. I have provided Danny [name] with COPD and CHF packets, and encouraged her to f/u with PCP next week. MD aware.

## 2013-05-11 NOTE — Discharge Summary (Signed)
Patient has decided to sign out AMA. Discussed with RN and patient's daughter Bonita QuinLinda. Patient states he wants to go home to be with his wife. He has never been in the hospital before. Bonita QuinLinda understands that with his acute diastolic CHF he is not medically ready for DC, but we see no choice other than to let him sign out AMA. I have advised daughter to secure an appointment with his PCP for next week.  Peggye PittEstela Hernandez, MD [facility] Pager: [phone]

## 2013-05-11 NOTE — Progress Notes (Signed)
  Echocardiogram 2D Echocardiogram has been performed.  Arvil ChacoFoster, Lyndie Vanderloop 05/11/2013, 10:17 AM

## 2013-05-11 NOTE — ED Notes (Signed)
Dr. Ranae PalmsYelverton notified of critical troponin of 0.31.

## 2013-05-11 NOTE — Progress Notes (Signed)
Pt received from ED at this time. Daughter was with pt. No distress noted at this time. Pt oriented to room with call bell in reach, side rails up, and bed locked. Pt was instructed to call the nurses when he needed to and pt verbalized understanding. Will continue to monitor pt.

## 2013-05-11 NOTE — ED Notes (Signed)
Pt reports a non-productive cough for the past two days, became ShOB yesterday with difficulty sleeping. Pt noted to be 89% on RA. Placed on 2L [place] at this time. Pt a&o x4, ambulatory to triage. Pt reports he feels anxious.

## 2013-05-11 NOTE — H&P (Signed)
PCP: Romero Belling, MD    Chief Complaint:  Shortness of breath  HPI: Danny [name] is a 80 y.o. male   has a past medical history of DIABETES MELLITUS, TYPE II (12/06/2006); NEPHROPATHY, DIABETIC (12/06/2006); HYPERLIPIDEMIA (12/06/2006); HYPOKALEMIA (01/12/2009); TOBACCO USER (05/13/2009); HYPERTENSION (12/06/2006); CORONARY ATHEROSCLEROSIS, NATIVE VESSEL (05/13/2009); Atrial fibrillation (05/04/2009); COPD (12/06/2006); GERD (12/06/2006); DIVERTICULOSIS, COLON (12/06/2006); CONSTIPATION (12/20/2007); RECTAL BLEEDING (12/20/2007); RENAL INSUFFICIENCY (12/20/2007); BACK PAIN, LUMBAR (01/21/2008); DIZZINESS OR VERTIGO (12/06/2006); ABNORMAL EKG (05/13/2009); Diastolic dysfunction; and Blindness of right eye.   Presented with   Sudden onset of shortness of breath he was clanching his chest. Family noted that he was sweaty.  Family noted that yesterday he was slightly wheezing. Denies any fever or chills. He is only taking aspirin 81 mg po. Patient continues to smoke. He has been seen by Dr. Gala Romney in the past and had Adenosine Myoview in January 2006 showed an EF of 48% with a fixed defect in the inferior wall but no ischemia.  Patient had presumed a.fib in the past and was started on coumadin but ran out and never restarted. He have self discontinued majority of his medications. Patient has hx of DM but this is diet controlled family is unaware that he was ever diagnosed as diabetic. Family does not endorse any leg swelling. He is very functional 80 yo at this point.  In ER troponin was noted elevated to 0.3, with ECG showing LVH but no acute ischemia. Er spoke to cardiology fellow on call and requested cardiology consult. Hospitalist to admit.   Review of Systems:     Pertinent positives include: shortness of breath, wheezing   Constitutional:  No weight loss, night sweats, Fevers, chills, fatigue, weight loss  HEENT:  No headaches, Difficulty swallowing,Tooth/dental problems,Sore throat,  No sneezing,  itching, ear ache, nasal congestion, post nasal drip,  Cardio-vascular:  No chest pain, Orthopnea, PND, anasarca, dizziness, palpitations.no Bilateral lower extremity swelling  GI:  No heartburn, indigestion, abdominal pain, nausea, vomiting, diarrhea, change in bowel habits, loss of appetite, melena, blood in stool, hematemesis Resp:  no shortness of breath at rest. No dyspnea on exertion, No excess mucus, no productive cough, No non-productive cough, No coughing up of blood.No change in color of mucus.No wheezing. Skin:  no rash or lesions. No jaundice GU:  no dysuria, change in color of urine, no urgency or frequency. No straining to urinate.  No flank pain.  Musculoskeletal:  No joint pain or no joint swelling. No decreased range of motion. No back pain.  Psych:  No change in mood or affect. No depression or anxiety. No memory loss.  Neuro: no localizing neurological complaints, no tingling, no weakness, no double vision, no gait abnormality, no slurred speech, no confusion  Otherwise ROS are negative except for above, 10 systems were reviewed  Past Medical History: Past Medical History  Diagnosis Date  . DIABETES MELLITUS, TYPE II 12/06/2006  . NEPHROPATHY, DIABETIC 12/06/2006  . HYPERLIPIDEMIA 12/06/2006  . HYPOKALEMIA 01/12/2009  . TOBACCO USER 05/13/2009  . HYPERTENSION 12/06/2006  . CORONARY ATHEROSCLEROSIS, NATIVE VESSEL 05/13/2009  . Atrial fibrillation 05/04/2009  . COPD 12/06/2006  . GERD 12/06/2006  . DIVERTICULOSIS, COLON 12/06/2006  . CONSTIPATION 12/20/2007  . RECTAL BLEEDING 12/20/2007  . RENAL INSUFFICIENCY 12/20/2007  . BACK PAIN, LUMBAR 01/21/2008  . DIZZINESS OR VERTIGO 12/06/2006  . ABNORMAL EKG 05/13/2009  . Diastolic dysfunction   . Blindness of right eye    History reviewed. No pertinent past surgical history.  Medications: Prior to Admission medications   Medication Sig Start Date End Date Taking? Authorizing Provider  aspirin 81 MG tablet Take 81 mg by mouth  every evening.    Yes Historical Provider, MD    Allergies:  No Known Allergies  Social History:  Ambulatory  independently   Lives at   Home with family    reports that he has been smoking Cigarettes.  He has been smoking about 1.00 pack per day. He has never used smokeless tobacco. He reports that he does not drink alcohol or use illicit drugs.   Family History: family history includes Diabetes Mellitus II in his mother; Hypertension in his other. There is no history of Cancer.    Physical Exam: Patient Vitals for the past 24 hrs:  BP Temp Temp src Pulse Resp SpO2  05/11/13 0154 178/92 mmHg 97.5 F (36.4 C) Oral 87 22 92 %    1. General:  in No Acute distress 2. Psychological: Alert and   Oriented 3. Head/ENT:   Moist Mucous Membranes                          Head Non traumatic, neck supple                          Normal  Dentition 4. SKIN: normal   Skin turgor,  Skin clean Dry and intact no rash 5. Heart: Regular rate and rhythm no Murmur, Rub or gallop 6. Lungs:  no wheezes occasional mild crackles at bases,   7. Abdomen: Soft, non-tender, Non distended 8. Lower extremities: no clubbing, cyanosis, or edema 9. Neurologically Grossly intact, moving all 4 extremities equally 10. MSK: Normal range of motion  body mass index is unknown because there is no weight on file.   Labs on Admission:   Recent Labs  05/11/13 0216  NA 135*  K 4.3  CL 95*  CO2 30  GLUCOSE 145*  BUN 13  CREATININE 0.93  CALCIUM 9.6    Recent Labs  05/11/13 0216  AST 24  ALT 9  ALKPHOS 72  BILITOT 0.5  PROT 8.5*  ALBUMIN 3.7   No results found for this basename: LIPASE, AMYLASE,  in the last 72 hours  Recent Labs  05/11/13 0216  WBC 5.2  NEUTROABS 3.7  HGB 13.9  HCT 40.3  MCV 95.3  PLT 225    Recent Labs  05/11/13 0216  TROPONINI 0.31*   No results found for this basename: TSH, T4TOTAL, FREET3, T3FREE, THYROIDAB,  in the last 72 hours No results found for this  basename: VITAMINB12, FOLATE, FERRITIN, TIBC, IRON, RETICCTPCT,  in the last 72 hours Lab Results  Component Value Date   HGBA1C 5.7* 01/11/2012    The CrCl is unknown because both a height and weight (above a minimum accepted value) are required for this calculation. ABG No results found for this basename: phart, pco2, po2, hco3, tco2, acidbasedef, o2sat     No results found for this basename: DDIMER     Other results:  I have pearsonaly reviewed this: ECG REPORT  Rate:83  Rhythm:  SR with PAC ST&T Change: no ischemic cahnges  UA no evidence of infection BNP 4546   Cultures: No results found for this basename: sdes, specrequest, cult, reptstatus       Radiological Exams on Admission: Dg Chest 2 View  05/11/2013   CLINICAL DATA:  Shortness of breath.  History of smoking.  EXAM: CHEST  2 VIEW  COMPARISON:  Chest radiograph performed 11/26/2009  FINDINGS: The lungs are well-aerated. Vascular congestion is noted, with diffusely increased interstitial markings, suspicious for pulmonary edema, though pneumonia might have a similar appearance. No pleural effusion or pneumothorax is seen.  The heart is mildly enlarged. No acute osseous abnormalities are seen.  IMPRESSION: Vascular congestion and mild cardiomegaly, with diffusely increased interstitial markings. This is suspicious for pulmonary edema, though pneumonia might have a similar appearance.   Electronically Signed   By: Roanna RaiderJeffery  Chang M.D.   On: 05/11/2013 03:01    Chart has been reviewed  Assessment/Plan   80 yo M with hx of CAD, DM, HTN and remote a.fib with ongoing tobacco abuse here with pulmonary edema and elevated troponin worrisome for CHF exacerbation vs NSTEMI  Present on Admission:  . [facility], repeat troponin if continues to go up would heparnize, cardiology consult in AM. Elevation in troponin could be due to cardiac strain in the setting of CHF. will obtain echo, give aspirin.  Continue diuresis. Cardiology consult . HYPERTENSION - start lisinopril . Elevated troponin - continue to cycle CE and watch trend, given advance age and remote hx of gi bleed would repeat troponin again to see the trend prior to deciding on heparinization.  Hx of DM - will check hg A1C, SSI  Prophylaxis:   Lovenox   CODE STATUS: Full code  Other plan as per orders.  I have spent a total of 55 min on this admission  Silvana Holecek 05/11/2013, 4:40 AM

## 2013-05-11 NOTE — ED Provider Notes (Signed)
CSN: [id number]     Arrival date & time 05/11/13  0141 History   First MD Initiated Contact with Patient 05/11/13 0208     Chief Complaint  Patient presents with  . Shortness of Breath  . Cough     (Consider location/radiation/quality/duration/timing/severity/associated sxs/prior Treatment) HPI Patient with increasing shortness of breath the last couple of days. Granddaughter states she's noticed that he has been breathing heavily and wheezing. He's had a mild cough without any sputum production. She's had no fever but admits to chills and diaphoresis. He denies any chest pain at this time. He has diffuse myalgias. Has no nausea or vomiting. No abdominal pain. No urinary symptoms. Past Medical History  Diagnosis Date  . DIABETES MELLITUS, TYPE II 12/06/2006  . NEPHROPATHY, DIABETIC 12/06/2006  . HYPERLIPIDEMIA 12/06/2006  . HYPOKALEMIA 01/12/2009  . TOBACCO USER 05/13/2009  . HYPERTENSION 12/06/2006  . CORONARY ATHEROSCLEROSIS, NATIVE VESSEL 05/13/2009  . Atrial fibrillation 05/04/2009  . COPD 12/06/2006  . GERD 12/06/2006  . DIVERTICULOSIS, COLON 12/06/2006  . CONSTIPATION 12/20/2007  . RECTAL BLEEDING 12/20/2007  . RENAL INSUFFICIENCY 12/20/2007  . BACK PAIN, LUMBAR 01/21/2008  . DIZZINESS OR VERTIGO 12/06/2006  . ABNORMAL EKG 05/13/2009  . Diastolic dysfunction   . Blindness of right eye    History reviewed. No pertinent past surgical history. Family History  Problem Relation Age of Onset  . Cancer Neg Hx   . Hypertension Other     Significant for hypertension  . Diabetes Mellitus II Mother    History  Substance Use Topics  . Smoking status: Current Every Day Smoker -- 1.00 packs/day    Types: Cigarettes  . Smokeless tobacco: Never Used  . Alcohol Use: No    Review of Systems  Constitutional: Positive for chills and fatigue. Negative for fever.  HENT: Negative for congestion.   Respiratory: Positive for cough, shortness of breath and wheezing. Negative for chest tightness.    Cardiovascular: Negative for chest pain, palpitations and leg swelling.  Gastrointestinal: Negative for nausea, vomiting, abdominal pain, diarrhea and constipation.  Musculoskeletal: Negative for back pain, myalgias, neck pain and neck stiffness.  Skin: Negative for rash and wound.  Neurological: Negative for dizziness, syncope, weakness, light-headedness, numbness and headaches.  All other systems reviewed and are negative.      Allergies  Review of patient's allergies indicates no known allergies.  Home Medications   Current Outpatient Rx  Name  Route  Sig  Dispense  Refill  . aspirin 81 MG tablet   Oral   Take 81 mg by mouth every evening.           BP 178/92  Pulse 87  Temp(Src) 97.5 F (36.4 C) (Oral)  Resp 22  SpO2 92% Physical Exam  Nursing note and vitals reviewed. Constitutional: He is oriented to person, place, and time. He appears well-developed and well-nourished. No distress.  HENT:  Head: Normocephalic and atraumatic.  Mouth/Throat: Oropharynx is clear and moist.  Eyes: EOM are normal. Pupils are equal, round, and reactive to light.  Neck: Normal range of motion. Neck supple.  Cardiovascular: Normal rate and regular rhythm.   Pulmonary/Chest: Effort normal. No respiratory distress. He has no wheezes. He has no rales.  Decreased air movement in bilateral lung fields  Abdominal: Soft. Bowel sounds are normal. He exhibits no distension and no mass. There is no tenderness. There is no rebound and no guarding.  Musculoskeletal: Normal range of motion. He exhibits no edema and no tenderness.  No  calf swelling or tenderness.  Neurological: He is alert and oriented to person, place, and time.  Patient moves all extremities without deficit. Sensation is grossly intact.  Skin: Skin is warm and dry. No rash noted. No erythema.  Psychiatric: He has a normal mood and affect. His behavior is normal.    ED Course  Procedures (including critical care time) Labs  Review Labs Reviewed  COMPREHENSIVE METABOLIC PANEL - Abnormal; Notable for the following:    Sodium 135 (*)    Chloride 95 (*)    Glucose, Bld 145 (*)    Total Protein 8.5 (*)    GFR calc non Af Amer 72 (*)    GFR calc Af Amer 83 (*)    All other components within normal limits  TROPONIN I - Abnormal; Notable for the following:    Troponin I 0.31 (*)    All other components within normal limits  PRO B NATRIURETIC PEPTIDE - Abnormal; Notable for the following:    Pro B Natriuretic peptide (BNP) 4546.0 (*)    All other components within normal limits  URINALYSIS, ROUTINE W REFLEX MICROSCOPIC - Abnormal; Notable for the following:    Protein, ur 100 (*)    All other components within normal limits  CBC WITH DIFFERENTIAL  URINE MICROSCOPIC-ADD ON   Imaging Review Dg Chest 2 View  05/11/2013   CLINICAL DATA:  Shortness of breath.  History of smoking.  EXAM: CHEST  2 VIEW  COMPARISON:  Chest radiograph performed 11/26/2009  FINDINGS: The lungs are well-aerated. Vascular congestion is noted, with diffusely increased interstitial markings, suspicious for pulmonary edema, though pneumonia might have a similar appearance. No pleural effusion or pneumothorax is seen.  The heart is mildly enlarged. No acute osseous abnormalities are seen.  IMPRESSION: Vascular congestion and mild cardiomegaly, with diffusely increased interstitial markings. This is suspicious for pulmonary edema, though pneumonia might have a similar appearance.   Electronically Signed   By: Roanna RaiderJeffery  Chang M.D.   On: 05/11/2013 03:01     EKG Interpretation   Date/Time:  Sunday May 11 2013 02:24:09 EST Ventricular Rate:  83 PR Interval:  203 QRS Duration: 137 QT Interval:  462 QTC Calculation: 543 R Axis:   56 Text Interpretation:  Sinus arrhythmia Right bundle branch block Probable  anteroseptal infarct, old Nonspecific T abnormalities, lateral leads  Confirmed by Jamarcus Laduke  MD, Ezinne Yogi ([id number]) on 05/11/2013 5:12:52 AM       MDM   Final diagnoses:  Elevated troponin  Chronic airway obstruction, not elsewhere classified  Pulmonary edema  Unspecified essential hypertension   Patient is resting comfortably in setting high 90s on 2 L. Patient is denying any chest pain. Chest x-ray and elevated BNP consistent with pulmonary edema. 40 mg Lasix given IV. Discussed with hospitalist and will admit. Cardiology will consult on the patient.     Loren Raceravid Kolbi Altadonna, MD 05/11/13 [phone]

## 2013-08-13 ENCOUNTER — Other Ambulatory Visit: Payer: Self-pay | Admitting: Endocrinology

## 2013-08-13 NOTE — Telephone Encounter (Signed)
Please advise if ok to refill. Med was filled by [facility] provider.  Thanks!

## 2013-12-04 ENCOUNTER — Ambulatory Visit: Payer: Medicare Other

## 2013-12-09 ENCOUNTER — Ambulatory Visit: Payer: Medicare Other

## 2013-12-09 ENCOUNTER — Ambulatory Visit (INDEPENDENT_AMBULATORY_CARE_PROVIDER_SITE_OTHER): Payer: Medicare Other

## 2013-12-09 DIAGNOSIS — Z23 Encounter for immunization: Secondary | ICD-10-CM

## 2014-02-20 ENCOUNTER — Emergency Department (HOSPITAL_COMMUNITY)
Admission: EM | Admit: 2014-02-20 | Discharge: 2014-02-20 | Disposition: A | Discharge status: Home / Self Care | Payer: Medicare Other | Attending: Emergency Medicine | Admitting: Emergency Medicine

## 2014-02-20 ENCOUNTER — Emergency Department (HOSPITAL_COMMUNITY): Payer: Medicare Other

## 2014-02-20 ENCOUNTER — Encounter (HOSPITAL_COMMUNITY): Payer: Self-pay | Admitting: *Deleted

## 2014-02-20 DIAGNOSIS — Z720 Tobacco use: Secondary | ICD-10-CM | POA: Diagnosis not present

## 2014-02-20 DIAGNOSIS — H5441 Blindness, right eye, normal vision left eye: Secondary | ICD-10-CM | POA: Insufficient documentation

## 2014-02-20 DIAGNOSIS — J209 Acute bronchitis, unspecified: Secondary | ICD-10-CM

## 2014-02-20 DIAGNOSIS — I2510 Atherosclerotic heart disease of native coronary artery without angina pectoris: Secondary | ICD-10-CM | POA: Diagnosis not present

## 2014-02-20 DIAGNOSIS — I10 Essential (primary) hypertension: Secondary | ICD-10-CM | POA: Diagnosis not present

## 2014-02-20 DIAGNOSIS — R42 Dizziness and giddiness: Secondary | ICD-10-CM | POA: Insufficient documentation

## 2014-02-20 DIAGNOSIS — E1129 Type 2 diabetes mellitus with other diabetic kidney complication: Secondary | ICD-10-CM | POA: Insufficient documentation

## 2014-02-20 DIAGNOSIS — J441 Chronic obstructive pulmonary disease with (acute) exacerbation: Secondary | ICD-10-CM | POA: Insufficient documentation

## 2014-02-20 DIAGNOSIS — Z7982 Long term (current) use of aspirin: Secondary | ICD-10-CM | POA: Insufficient documentation

## 2014-02-20 DIAGNOSIS — J440 Chronic obstructive pulmonary disease with acute lower respiratory infection: Secondary | ICD-10-CM

## 2014-02-20 DIAGNOSIS — Z8719 Personal history of other diseases of the digestive system: Secondary | ICD-10-CM | POA: Insufficient documentation

## 2014-02-20 DIAGNOSIS — Z87448 Personal history of other diseases of urinary system: Secondary | ICD-10-CM | POA: Diagnosis not present

## 2014-02-20 DIAGNOSIS — R0602 Shortness of breath: Secondary | ICD-10-CM | POA: Diagnosis present

## 2014-02-20 MED ORDER — IPRATROPIUM-ALBUTEROL 0.5-2.5 (3) MG/3ML IN SOLN
3.0000 mL | Freq: Once | RESPIRATORY_TRACT | Status: AC
  Administered 2014-02-20: 3 mL via RESPIRATORY_TRACT
  Filled 2014-02-20: qty 3

## 2014-02-20 MED ORDER — ALBUTEROL SULFATE HFA 108 (90 BASE) MCG/ACT IN AERS
1.0000 | INHALATION_SPRAY | RESPIRATORY_TRACT | Status: DC | PRN
  Administered 2014-02-20: 2 via RESPIRATORY_TRACT
  Filled 2014-02-20: qty 6.7

## 2014-02-20 MED ORDER — AZITHROMYCIN 250 MG PO TABS
500.0000 mg | ORAL_TABLET | Freq: Once | ORAL | Status: AC
  Administered 2014-02-20: 500 mg via ORAL
  Filled 2014-02-20: qty 2

## 2014-02-20 MED ORDER — GUAIFENESIN ER 600 MG PO TB12: 1200.0000 mg | ORAL_TABLET | Freq: Two times a day (BID) | ORAL | Status: DC

## 2014-02-20 MED ORDER — ALBUTEROL SULFATE HFA 108 (90 BASE) MCG/ACT IN AERS: 1.0000 | INHALATION_SPRAY | Freq: Four times a day (QID) | RESPIRATORY_TRACT | Status: AC | PRN

## 2014-02-20 MED ORDER — AZITHROMYCIN 250 MG PO TABS: 250.0000 mg | ORAL_TABLET | Freq: Every day | ORAL | Status: DC

## 2014-02-20 NOTE — ED Provider Notes (Signed)
CSN: [id number]     Arrival date & time 02/20/14  0133 History   First MD Initiated Contact with Patient 02/20/14 0214     Chief Complaint  Patient presents with  . Dizziness  . Generalized Body Aches     (Consider location/radiation/quality/duration/timing/severity/associated sxs/prior Treatment) HPI 80 year old male presents to the emergency department with complaint of shortness of breath, cough, dizziness and weakness with body aches starting yesterday.  Patient is overall well, lives alone with his wife at home.  He has history of diabetes, hyperlipidemia, tobacco abuse, hypertension, coronary artery disease, COPD, renal insufficiency.  Patient reports that he is feeling much better since being here in the emergency department.  He has received a flu shot. Past Medical History  Diagnosis Date  . DIABETES MELLITUS, TYPE II 12/06/2006  . NEPHROPATHY, DIABETIC 12/06/2006  . HYPERLIPIDEMIA 12/06/2006  . HYPOKALEMIA 01/12/2009  . TOBACCO USER 05/13/2009  . HYPERTENSION 12/06/2006  . CORONARY ATHEROSCLEROSIS, NATIVE VESSEL 05/13/2009  . Atrial fibrillation 05/04/2009  . COPD 12/06/2006  . GERD 12/06/2006  . DIVERTICULOSIS, COLON 12/06/2006  . CONSTIPATION 12/20/2007  . RECTAL BLEEDING 12/20/2007  . RENAL INSUFFICIENCY 12/20/2007  . BACK PAIN, LUMBAR 01/21/2008  . DIZZINESS OR VERTIGO 12/06/2006  . ABNORMAL EKG 05/13/2009  . Diastolic dysfunction   . Blindness of right eye    Past Surgical History  Procedure Laterality Date  . Eye surgery Bilateral     cataratcs   Family History  Problem Relation Age of Onset  . Cancer Neg Hx   . Hypertension Other     Significant for hypertension  . Diabetes Mellitus II Mother    History  Substance Use Topics  . Smoking status: Current Every Day Smoker -- 1.00 packs/day    Types: Cigarettes  . Smokeless tobacco: Never Used  . Alcohol Use: No    Review of Systems   See History of Present Illness; otherwise all other systems are reviewed and  negative  Allergies  Review of patient's allergies indicates no known allergies.  Home Medications   Prior to Admission medications   Medication Sig Start Date End Date Taking? Authorizing Provider  aspirin 81 MG tablet Take 81 mg by mouth every evening.    Yes Historical Provider, MD   BP 176/86 mmHg  Pulse 62  Temp(Src) 97.5 F (36.4 C) (Oral)  Resp 18  SpO2 96% Physical Exam  Constitutional: He is oriented to person, place, and time. He appears well-developed and well-nourished. No distress.  HENT:  Head: Normocephalic and atraumatic.  Nose: Nose normal.  Mouth/Throat: Oropharynx is clear and moist.  Neck: Normal range of motion. Neck supple. No JVD present. No tracheal deviation present. No thyromegaly present.  Cardiovascular: Normal rate, regular rhythm, normal heart sounds and intact distal pulses.  Exam reveals no gallop and no friction rub.   No murmur heard. Pulmonary/Chest: Effort normal. No stridor. No respiratory distress. He has wheezes. He has no rales. He exhibits no tenderness.  Abdominal: Soft. Bowel sounds are normal. He exhibits no distension and no mass. There is no tenderness. There is no rebound and no guarding.  Musculoskeletal: Normal range of motion. He exhibits no edema or tenderness.  Lymphadenopathy:    He has no cervical adenopathy.  Neurological: He is alert and oriented to person, place, and time. He displays normal reflexes. He exhibits normal muscle tone. Coordination normal.  Skin: Skin is warm and dry. No rash noted. No erythema. No pallor.  Psychiatric: He has a normal mood and  affect. His behavior is normal. Judgment and thought content normal.  Nursing note and vitals reviewed.   ED Course  Procedures (including critical care time) Labs Review Labs Reviewed - No data to display  Imaging Review Dg Chest 2 View  02/20/2014   CLINICAL DATA:  Shortness of breath, weakness, personal history of diabetes, smoking, hypertension, coronary  artery disease  EXAM: CHEST  2 VIEW  COMPARISON:  05/11/2013  FINDINGS: Enlargement of cardiac silhouette.  Calcified tortuous aorta.  New pulmonary vascularity normal.  Diffusely accentuated interstitial markings throughout both lungs, similar to previous exam, question mild diffuse edema versus chronic interstitial lung disease.  Underlying COPD changes.  No gross pleural effusion or pneumothorax.  Bones diffusely demineralized with chronic rotator cuff tears bilaterally.  IMPRESSION: Enlargement of cardiac silhouette.  Emphysematous changes with scattered diffuse interstitial changes bilaterally, question mild pulmonary edema versus chronic interstitial lung disease, similar to previous exam.   Electronically Signed   By: Ulyses SouthwardMark  Boles M.D.   On: 02/20/2014 03:08     EKG Interpretation None      MDM   Final diagnoses:  SOB (shortness of breath)  COPD (chronic obstructive pulmonary disease) with acute bronchitis   80 year old male with shortness of breath, wheezing.  Suspect COPD exacerbation, but may be early pneumonia.  Plan for chest x-ray, DuoNeb and reassess.  Olivia Mackielga M Kamarii Buren, MD 02/20/14 [phone]

## 2014-02-20 NOTE — ED Notes (Signed)
Pt reports dizziness when standing and generalized body aches since yesterday.  Pt is A&Ox 4, and lives with his wife.

## 2014-02-20 NOTE — Discharge Instructions (Signed)
°  Chronic Obstructive Pulmonary Disease Exacerbation  Chronic obstructive pulmonary disease (COPD) is a common lung problem. In COPD, the flow of air from the lungs is limited. COPD exacerbations are times that breathing gets worse and you need extra treatment. Without treatment they can be life threatening. If they happen often, your lungs can become more damaged. HOME CARE  Do not smoke.  Avoid tobacco smoke and other things that bother your lungs.  If given, take your antibiotic medicine as told. Finish the medicine even if you start to feel better.  Only take medicines as told by your doctor.  Drink enough fluids to keep your pee (urine) clear or pale yellow (unless your doctor has told you not to).  Use a cool mist machine (vaporizer).  If you use oxygen or a machine that turns liquid medicine into a mist (nebulizer), continue to use them as told.  Keep up with shots (vaccinations) as told by your doctor.  Exercise regularly.  Eat healthy foods.  Keep all doctor visits as told. GET HELP RIGHT AWAY IF:  You are very short of breath and it gets worse.  You have trouble talking.  You have bad chest pain.  You have blood in your spit (sputum).  You have a fever.  You keep throwing up (vomiting).  You feel weak, or you pass out (faint).  You feel confused.  You keep getting worse. MAKE SURE YOU:   Understand these instructions.  Will watch your condition.  Will get help right away if you are not doing well or get worse. Document Released: 02/16/2011 Document Revised: 12/18/2012 Document Reviewed: 11/01/2012 [facility] Patient Information 2015 [place], [place]. This information is not intended to replace advice given to you by your health care provider. Make sure you discuss any questions you have with your health care provider.  Shortness of Breath Shortness of breath means you have trouble breathing. Shortness of breath needs medical care right away. HOME CARE     Do not smoke.  Avoid being around chemicals or things (paint fumes, dust) that may bother your breathing.  Rest as needed. Slowly begin your normal activities.  Only take medicines as told by your doctor.  Keep all doctor visits as told. GET HELP RIGHT AWAY IF:   Your shortness of breath gets worse.  You feel lightheaded, pass out (faint), or have a cough that is not helped by medicine.  You cough up blood.  You have pain with breathing.  You have pain in your chest, arms, shoulders, or belly (abdomen).  You have a fever.  You cannot walk up stairs or exercise the way you normally do.  You do not get better in the time expected.  You have a hard time doing normal activities even with rest.  You have problems with your medicines.  You have any new symptoms. MAKE SURE YOU:  Understand these instructions.  Will watch your condition.  Will get help right away if you are not doing well or get worse. Document Released: 08/16/2007 Document Revised: 03/04/2013 Document Reviewed: 05/15/2011 [facility] Patient Information 2015 [place], [place]. This information is not intended to replace advice given to you by your health care provider. Make sure you discuss any questions you have with your health care provider.

## 2014-02-25 ENCOUNTER — Ambulatory Visit (INDEPENDENT_AMBULATORY_CARE_PROVIDER_SITE_OTHER): Payer: Medicare Other | Admitting: Endocrinology

## 2014-02-25 ENCOUNTER — Encounter: Payer: Self-pay | Admitting: Endocrinology

## 2014-02-25 VITALS — BP 138/84 | HR 60 | Temp 98.0°F | Ht 68.5 in | Wt 128.0 lb

## 2014-02-25 DIAGNOSIS — R062 Wheezing: Secondary | ICD-10-CM

## 2014-02-25 DIAGNOSIS — Z0000 Encounter for general adult medical examination without abnormal findings: Secondary | ICD-10-CM

## 2014-02-25 DIAGNOSIS — H538 Other visual disturbances: Secondary | ICD-10-CM

## 2014-02-25 DIAGNOSIS — E785 Hyperlipidemia, unspecified: Secondary | ICD-10-CM

## 2014-02-25 DIAGNOSIS — I10 Essential (primary) hypertension: Secondary | ICD-10-CM

## 2014-02-25 DIAGNOSIS — N259 Disorder resulting from impaired renal tubular function, unspecified: Secondary | ICD-10-CM

## 2014-02-25 DIAGNOSIS — I482 Chronic atrial fibrillation, unspecified: Secondary | ICD-10-CM

## 2014-02-25 DIAGNOSIS — J441 Chronic obstructive pulmonary disease with (acute) exacerbation: Secondary | ICD-10-CM

## 2014-02-25 DIAGNOSIS — Z125 Encounter for screening for malignant neoplasm of prostate: Secondary | ICD-10-CM

## 2014-02-25 LAB — BASIC METABOLIC PANEL
BUN: 30 mg/dL — ABNORMAL HIGH (ref 6–23)
CHLORIDE: 99 meq/L (ref 96–112)
CO2: 25 mEq/L (ref 19–32)
CREATININE: 1.29 mg/dL (ref 0.50–1.35)
Calcium: 9 mg/dL (ref 8.4–10.5)
Glucose, Bld: 119 mg/dL — ABNORMAL HIGH (ref 70–99)
Potassium: 4.1 mEq/L (ref 3.5–5.3)
SODIUM: 135 meq/L (ref 135–145)

## 2014-02-25 LAB — HEPATIC FUNCTION PANEL
ALBUMIN: 3.3 g/dL — AB (ref 3.5–5.2)
ALT: 10 U/L (ref 0–53)
AST: 20 U/L (ref 0–37)
Alkaline Phosphatase: 56 U/L (ref 39–117)
BILIRUBIN INDIRECT: 1.2 mg/dL (ref 0.2–1.2)
BILIRUBIN TOTAL: 1.6 mg/dL — AB (ref 0.2–1.2)
Bilirubin, Direct: 0.4 mg/dL — ABNORMAL HIGH (ref 0.0–0.3)
Total Protein: 7.6 g/dL (ref 6.0–8.3)

## 2014-02-25 MED ORDER — FLUTICASONE-SALMETEROL 100-50 MCG/DOSE IN AEPB: 1.0000 | INHALATION_SPRAY | Freq: Two times a day (BID) | RESPIRATORY_TRACT | Status: DC

## 2014-02-25 NOTE — Progress Notes (Signed)
Subjective:    Patient ID: Danny [name], male    DOB: [birth year], 80 y.o.   MRN: [id number]  HPI Pt states few days of moderate pain at both feet, worse with ambulation.  No assoc numbness. He also c/o slight lightheadedness.    Past Medical History  Diagnosis Date  . DIABETES MELLITUS, TYPE II 12/06/2006  . NEPHROPATHY, DIABETIC 12/06/2006  . HYPERLIPIDEMIA 12/06/2006  . HYPOKALEMIA 01/12/2009  . TOBACCO USER 05/13/2009  . HYPERTENSION 12/06/2006  . CORONARY ATHEROSCLEROSIS, NATIVE VESSEL 05/13/2009  . Atrial fibrillation 05/04/2009  . COPD 12/06/2006  . GERD 12/06/2006  . DIVERTICULOSIS, COLON 12/06/2006  . CONSTIPATION 12/20/2007  . RECTAL BLEEDING 12/20/2007  . RENAL INSUFFICIENCY 12/20/2007  . BACK PAIN, LUMBAR 01/21/2008  . DIZZINESS OR VERTIGO 12/06/2006  . ABNORMAL EKG 05/13/2009  . Diastolic dysfunction   . Blindness of right eye     Past Surgical History  Procedure Laterality Date  . Eye surgery Bilateral     cataratcs    History   Social History  . Marital Status: Married    Spouse Name: N/A    Number of Children: N/A  . Years of Education: N/A   Occupational History  . Retired    Social History Main Topics  . Smoking status: Current Every Day Smoker -- 1.00 packs/day    Types: Cigarettes  . Smokeless tobacco: Never Used  . Alcohol Use: No  . Drug Use: No  . Sexual Activity: No   Other Topics Concern  . Not on file   Social History Narrative    Current Outpatient Prescriptions on File Prior to Visit  Medication Sig Dispense Refill  . albuterol (PROVENTIL HFA;VENTOLIN HFA) 108 (90 BASE) MCG/ACT inhaler Inhale 1-2 puffs into the lungs every 6 (six) hours as needed for wheezing or shortness of breath. 1 Inhaler 0  . aspirin 81 MG tablet Take 81 mg by mouth every evening.     Marland Kitchen. guaiFENesin (MUCINEX) 600 MG 12 hr tablet Take 2 tablets (1,200 mg total) by mouth 2 (two) times daily. 30 tablet 0   No current facility-administered medications on file prior to  visit.    No Known Allergies  Family History  Problem Relation Age of Onset  . Cancer Neg Hx   . Hypertension Other     Significant for hypertension  . Diabetes Mellitus II Mother     BP 138/84 mmHg  Pulse 60  Temp(Src) 98 F (36.7 C) (Oral)  Ht 5' 8.5" (1.74 m)  Wt 128 lb (58.06 kg)  BMI 19.18 kg/m2  SpO2 95%  Review of Systems Pt says MDI helps (he was seen in ER last week, for COPD exacerbation).  He has slight lightheadedness.  He has chronic blurry vision, from his one eye.     Objective:   Physical Exam Vital signs: see vs page.  Gen: elderly, frail, no distress.  LUNGS:  Clear to auscultation.  HEART:  Regular rate and rhythm without murmurs noted. Normal S1,S2.   Pulses: dorsalis pedis intact bilat.   Feet: no deformity.  no edema.  There is bilateral onychomycosis.  Skin:  no ulcer on the feet, but skin is dry  normal color and temp.     Neuro: sensation is intact to touch on the feet.   Gait: slow and slightly unsteady.     i reviewed electrocardiogram and spirometry.    Lab Results  Component Value Date   WBC 6.3 02/25/2014   HGB 14.2 02/25/2014  HCT 40.2 02/25/2014   PLT 214 02/25/2014   GLUCOSE 119* 02/25/2014   CHOL 131 01/11/2012   TRIG 106 01/11/2012   HDL 36* 01/11/2012   LDLCALC 74 01/11/2012   ALT 10 02/25/2014   AST 20 02/25/2014   NA 135 02/25/2014   K 4.1 02/25/2014   CL 99 02/25/2014   CREATININE 1.29 02/25/2014   BUN 30* 02/25/2014   CO2 25 02/25/2014   TSH 1.505 02/25/2014   PSA 0.44 02/25/2014   INR 2.6 05/13/2009   HGBA1C 6.0* 02/25/2014   MICROALBUR 61.21* 01/11/2012   radiol: head CT was recently done in ER: NAD     Assessment & Plan:  COPD, persistent.   Blurry vision, uncertain etiology. Lightheadedness, new.  This has a long DDX, and no cause is evident now.  We'll follow this for now.    Patient is advised the following: Patient Instructions  blood tests are being requested for you today.  We'll let you know  about the results. Please see a lung specialist.  you will receive a phone call, about a day and time for an appointment. There are many possible causes of dizziness.  We'll start by checking blood tests today. Please see an eye specialist.  you will receive a phone call, about a day and time for an appointment. it is critically important to prevent falling down (keep floor areas well-lit, dry, and free of loose objects.  If you have a cane, walker, or wheelchair, you should use it, even for short trips around the house.  Also, try not to rush).   i have sent a prescription to your pharmacy, for another inhaler.   Please come back for a regular physical appointment in 1 month.

## 2014-02-25 NOTE — Patient Instructions (Addendum)
blood tests are being requested for you today.  We'll let you know about the results. Please see a lung specialist.  you will receive a phone call, about a day and time for an appointment. There are many possible causes of dizziness.  We'll start by checking blood tests today. Please see an eye specialist.  you will receive a phone call, about a day and time for an appointment. it is critically important to prevent falling down (keep floor areas well-lit, dry, and free of loose objects.  If you have a cane, walker, or wheelchair, you should use it, even for short trips around the house.  Also, try not to rush).   i have sent a prescription to your pharmacy, for another inhaler.   Please come back for a regular physical appointment in 1 month.

## 2014-02-26 LAB — CBC WITH DIFFERENTIAL/PLATELET
Basophils Absolute: 0.1 10*3/uL (ref 0.0–0.1)
Basophils Relative: 1 % (ref 0–1)
EOS PCT: 1 % (ref 0–5)
Eosinophils Absolute: 0.1 10*3/uL (ref 0.0–0.7)
HCT: 40.2 % (ref 39.0–52.0)
HEMOGLOBIN: 14.2 g/dL (ref 13.0–17.0)
LYMPHS ABS: 1.3 10*3/uL (ref 0.7–4.0)
LYMPHS PCT: 20 % (ref 12–46)
MCH: 30.9 pg (ref 26.0–34.0)
MCHC: 35.3 g/dL (ref 30.0–36.0)
MCV: 87.4 fL (ref 78.0–100.0)
MPV: 10.1 fL (ref 9.4–12.4)
Monocytes Absolute: 0.9 10*3/uL (ref 0.1–1.0)
Monocytes Relative: 14 % — ABNORMAL HIGH (ref 3–12)
NEUTROS ABS: 4 10*3/uL (ref 1.7–7.7)
Neutrophils Relative %: 64 % (ref 43–77)
Platelets: 214 10*3/uL (ref 150–400)
RBC: 4.6 MIL/uL (ref 4.22–5.81)
RDW: 14.8 % (ref 11.5–15.5)
WBC: 6.3 10*3/uL (ref 4.0–10.5)

## 2014-02-26 LAB — PSA: PSA: 0.44 ng/mL (ref <=4.00)

## 2014-02-26 LAB — TSH: TSH: 1.505 u[IU]/mL (ref 0.350–4.500)

## 2014-02-26 LAB — HEMOGLOBIN A1C
Hgb A1c MFr Bld: 6 % — ABNORMAL HIGH (ref <5.7)
Mean Plasma Glucose: 126 mg/dL — ABNORMAL HIGH (ref <117)

## 2014-03-09 ENCOUNTER — Telehealth: Payer: Self-pay | Admitting: Endocrinology

## 2014-03-09 NOTE — Telephone Encounter (Signed)
Patient daughter is calling for the results of her dad labs.  Ask for Juliette AlcideMelinda [phone]

## 2014-03-12 ENCOUNTER — Ambulatory Visit (INDEPENDENT_AMBULATORY_CARE_PROVIDER_SITE_OTHER): Payer: Medicare Other | Admitting: Internal Medicine

## 2014-03-12 ENCOUNTER — Encounter: Payer: Self-pay | Admitting: Internal Medicine

## 2014-03-12 VITALS — BP 116/60 | HR 74 | Ht 68.0 in | Wt 126.0 lb

## 2014-03-12 DIAGNOSIS — I5022 Chronic systolic (congestive) heart failure: Secondary | ICD-10-CM | POA: Insufficient documentation

## 2014-03-12 DIAGNOSIS — J449 Chronic obstructive pulmonary disease, unspecified: Secondary | ICD-10-CM

## 2014-03-12 DIAGNOSIS — Z720 Tobacco use: Secondary | ICD-10-CM

## 2014-03-12 DIAGNOSIS — J441 Chronic obstructive pulmonary disease with (acute) exacerbation: Secondary | ICD-10-CM

## 2014-03-12 DIAGNOSIS — F17210 Nicotine dependence, cigarettes, uncomplicated: Secondary | ICD-10-CM

## 2014-03-12 DIAGNOSIS — F17200 Nicotine dependence, unspecified, uncomplicated: Secondary | ICD-10-CM

## 2014-03-12 MED ORDER — FLUTICASONE FUROATE-VILANTEROL 100-25 MCG/INH IN AEPB: 1.0000 | INHALATION_SPRAY | Freq: Every morning | RESPIRATORY_TRACT | Status: DC

## 2014-03-12 NOTE — Assessment & Plan Note (Signed)

## 2014-03-12 NOTE — Progress Notes (Signed)
Subjective:     Patient ID: Danny [name], Danny [name]   DOB: 1920/08/12,    MRN: [id number]  HPI  [age] yobm smoker with new sob > ER > referred by Everardo AllEllison to pulmonary clinic 03/12/2014 for copd eval   . 03/12/2014 [facility]st [facility] office visit/ Wert  / advair 100 one bid but not consistent with it  Chief Complaint  Patient presents with  . Pulmonary Consult    Referred by Dr. Everardo AllEllison. Pt c/o problems breathing "now and then".  Went to ED on 02/20/14 with dizziness as the CC, but he states SOB was bothering him that day, and has improved since then. He also c/o cough "now and then"- non prod and worse in the am's.   2 months prior to OV   could walk HT /foodlion and walk across parking lot then aburptly couldn't do it anymore per fm and now clear why (in retrospect could not do a WM x several decades but could push mower summer 2014)  Now walks maybe 100 ft and "gives out"  Min cough but if he cougs it's typically in am , mucoid  No obvious day to day or daytime variabilty or assoc chronic cough or cp or chest tightness, subjective wheeze overt sinus or hb symptoms. No unusual exp hx or h/o childhood pna/ asthma or knowledge of premature birth.  Sleeping ok without nocturnal  or early am exacerbation  of respiratory  c/o's or need for noct saba. Also denies any obvious fluctuation of symptoms with weather or environmental changes or other aggravating or alleviating factors except as outlined above   Current Medications, Allergies, Complete Past Medical History, Past Surgical History, Family History, and Social History [facility].  ROS  The following are not active complaints unless bolded sore throat, dysphagia, dental problems, itching, sneezing,  nasal congestion or excess/ purulent secretions, ear ache,   fever, chills, sweats, unintended wt loss, pleuritic or exertional cp, hemoptysis,  orthopnea pnd or leg swelling, presyncope, palpitations,  heartburn, abdominal pain, anorexia, nausea, vomiting, diarrhea  or change in bowel or urinary habits, change in stools or urine, dysuria,hematuria,  rash, arthralgias, visual complaints, headache, numbness weakness or ataxia or problems with walking or coordination,  change in mood/affect or memory.         Review of Systems     Objective:   Physical Exam    amb bm nad congested cough   Wt Readings from Last 3 Encounters:  03/12/14 126 lb (57.153 kg)  02/25/14 128 lb (58.06 kg)  05/11/13 128 lb 11.2 oz (58.378 kg)    Vital signs reviewed  HEENT mild turbinate edema.  Edentulous/ Oropharynx no thrush or excess pnd or cobblestoning.  No JVD or cervical adenopathy. Mild accessory muscle hypertrophy. Trachea midline, nl thryroid. Chest was hyperinflated by percussion with diminished breath sounds and moderate increased exp time without wheeze. Hoover sign positive at mid inspiration. Regular rate and rhythm without murmur gallop or rub or increase P2 or edema.  Abd: no hsm, nl excursion. Ext warm without cyanosis or clubbing.    cxr 02/20/14 Enlargement of cardiac silhouette. Emphysematous changes with scattered diffuse interstitial changes bilaterally, question mild pulmonary edema versus chronic interstitial lung disease, similar to previous exam.      Chemistry      Component Value Date/Time   NA 135 02/25/2014 1741   K 4.1 02/25/2014 1741   CL 99 02/25/2014 1741   CO2 25 02/25/2014 1741  BUN 30* 02/25/2014 1741   CREATININE 1.29 02/25/2014 1741   CREATININE 0.89 05/11/2013 0756      Component Value Date/Time   CALCIUM 9.0 02/25/2014 1741   CALCIUM 9.4 01/11/2012 1029   ALKPHOS 56 02/25/2014 1741   AST 20 02/25/2014 1741   ALT 10 02/25/2014 1741   BILITOT 1.6* 02/25/2014 1741       Lab Results  Component Value Date   TSH 1.505 02/25/2014     Lab Results  Component Value Date   PROBNP 4546.0* 05/11/2013      Lab Results  Component Value Date   WBC 6.3  02/25/2014   HGB 14.2 02/25/2014   HCT 40.2 02/25/2014   MCV 87.4 02/25/2014   PLT 214 02/25/2014       Assessment:

## 2014-03-12 NOTE — Assessment & Plan Note (Addendum)
05/11/13 echo  Normal LV size with mild LV hypertrophy. EF 30% with global hypokinesis, worse in the posterior wall. Moderate diastolic dysfunction. Normal RV size and systolic function. Aortic sclerosis without significant stenosis  Suspect this has as much to do with his symptoms, esp the abrupt ones as his copd, probably should be on acei or arb if tolerates > defer whether to refer to cards

## 2014-03-12 NOTE — Assessment & Plan Note (Addendum)
-   03/12/2014   Walked RA x one lap @ 185 stopped due to  Weak legs, no sob/no desat   DDX of  difficult airways management all start with A and  include Adherence, Ace Inhibitors, Acid Reflux, Active Sinus Disease, Alpha 1 Antitripsin deficiency, Anxiety masquerading as Airways dz,  ABPA,  allergy(esp in young), Aspiration (esp in elderly), Adverse effects of DPI,  Active smokers, plus two Bs  = Bronchiectasis and Beta blocker use..and one C= CHF   Adherence is always the initial "prime suspect" and is a multilayered concern that requires a "trust but verify" approach in every patient - starting with knowing how to use medications, especially inhalers, correctly, keeping up with refills and understanding the fundamental difference between maintenance and prns vs those medications only taken for a very short course and then stopped and not refilled.  - limited cognitive fxn so need to keep things simple - The proper method of use, as well as anticipated side effects, of a metered-dose inhaler are discussed and demonstrated to the patient. Improved effectiveness after extensive coaching during this visit to a level of approximately  90% only with dpi > try BREO each am   Active smoking > discussed separately   Chf > discussed separately     Each maintenance medication was reviewed in detail including most importantly the difference between maintenance and as needed and under what circumstances the prns are to be used.  Please see instructions for details which were reviewed in writing and the patient given a copy.

## 2014-03-12 NOTE — Patient Instructions (Signed)
Stop advair and Start BREO 100 click once each am and take two drags and fill the rx if helping more than advair   The key is to stop smoking completely before smoking completely stops you!  Please schedule a follow up office visit in 4 weeks, sooner if needed with pfts

## 2014-03-14 ENCOUNTER — Telehealth: Payer: Self-pay | Admitting: Endocrinology

## 2014-03-14 DIAGNOSIS — I5022 Chronic systolic (congestive) heart failure: Secondary | ICD-10-CM

## 2014-03-14 NOTE — Telephone Encounter (Signed)
please call patient: i got a message from dr wert.  Looks like you should f/u in cardiol.  Please see a specialist.  you will receive a phone call, about a day and time for an appointment.

## 2014-03-16 NOTE — Telephone Encounter (Signed)
Unable to reach pt. Will try again at a later time.  

## 2014-03-17 NOTE — Telephone Encounter (Signed)
Spoke with pt's daughter of recent labs and voiced understanding.

## 2014-03-17 NOTE — Telephone Encounter (Signed)
Pt's daughter advised of note below and voiced understanding. Daughter wanted MD to be advised that on 03/31/14 when the pt comes in for his appointment dementia needs to be addressed. Daughter states that the pt has not been eating and going to the bathroom and she is concerned about that as well.

## 2014-03-17 NOTE — Telephone Encounter (Signed)
Requested call back to discuss.  

## 2014-03-17 NOTE — Telephone Encounter (Signed)
Unable to reach pt. Will try again at a later time.  

## 2014-03-17 NOTE — Telephone Encounter (Signed)
Unable to reach pt's daughter. Will try again at a later time.

## 2014-03-19 DIAGNOSIS — R4182 Altered mental status, unspecified: Secondary | ICD-10-CM | POA: Diagnosis not present

## 2014-03-19 DIAGNOSIS — N289 Disorder of kidney and ureter, unspecified: Secondary | ICD-10-CM | POA: Diagnosis not present

## 2014-03-19 DIAGNOSIS — R748 Abnormal levels of other serum enzymes: Secondary | ICD-10-CM | POA: Diagnosis not present

## 2014-03-19 DIAGNOSIS — T68XXXA Hypothermia, initial encounter: Secondary | ICD-10-CM | POA: Diagnosis not present

## 2014-03-31 ENCOUNTER — Encounter: Payer: Self-pay | Admitting: Endocrinology

## 2014-03-31 ENCOUNTER — Ambulatory Visit (INDEPENDENT_AMBULATORY_CARE_PROVIDER_SITE_OTHER): Payer: Medicare Other | Admitting: Endocrinology

## 2014-03-31 VITALS — BP 128/70 | HR 69 | Temp 97.8°F | Ht 68.0 in | Wt 133.0 lb

## 2014-03-31 DIAGNOSIS — R413 Other amnesia: Secondary | ICD-10-CM

## 2014-03-31 MED ORDER — ZOLPIDEM TARTRATE 5 MG PO TABS: 5.0000 mg | ORAL_TABLET | Freq: Every evening | ORAL | Status: AC | PRN

## 2014-03-31 NOTE — Progress Notes (Signed)
Subjective:    Patient ID: Danny [name], male    DOB: 06/13/1920, 81 y.o.   MRN: [id number]  HPI Pt's dtr says 3 months of late-night wandering around the house.  He has slight dizziness sensation in the head, but no assoc falls.   Past Medical History  Diagnosis Date  . DIABETES MELLITUS, TYPE II 12/06/2006  . NEPHROPATHY, DIABETIC 12/06/2006  . HYPERLIPIDEMIA 12/06/2006  . HYPOKALEMIA 01/12/2009  . TOBACCO USER 05/13/2009  . HYPERTENSION 12/06/2006  . CORONARY ATHEROSCLEROSIS, NATIVE VESSEL 05/13/2009  . Atrial fibrillation 05/04/2009  . COPD 12/06/2006  . GERD 12/06/2006  . DIVERTICULOSIS, COLON 12/06/2006  . CONSTIPATION 12/20/2007  . RECTAL BLEEDING 12/20/2007  . RENAL INSUFFICIENCY 12/20/2007  . BACK PAIN, LUMBAR 01/21/2008  . DIZZINESS OR VERTIGO 12/06/2006  . ABNORMAL EKG 05/13/2009  . Diastolic dysfunction   . Blindness of right eye     Past Surgical History  Procedure Laterality Date  . Eye surgery Bilateral     cataratcs    History   Social History  . Marital Status: Married    Spouse Name: N/A    Number of Children: N/A  . Years of Education: N/A   Occupational History  . Retired    Social History Main Topics  . Smoking status: Current Every Day Smoker -- 1.00 packs/day for 73 years    Types: Cigarettes  . Smokeless tobacco: Never Used  . Alcohol Use: No  . Drug Use: No  . Sexual Activity: No   Other Topics Concern  . Not on file   Social History Narrative    Current Outpatient Prescriptions on File Prior to Visit  Medication Sig Dispense Refill  . albuterol (PROVENTIL HFA;VENTOLIN HFA) 108 (90 BASE) MCG/ACT inhaler Inhale 1-2 puffs into the lungs every 6 (six) hours as needed for wheezing or shortness of breath. 1 Inhaler 0  . aspirin 81 MG tablet Take 81 mg by mouth every evening.     . Fluticasone Furoate-Vilanterol (BREO ELLIPTA) 100-25 MCG/INH AEPB Inhale 1 puff into the lungs every morning. 2 each 0   No current facility-administered medications  on file prior to visit.    No Known Allergies  Family History  Problem Relation Age of Onset  . Cancer Neg Hx   . Hypertension Other     Significant for hypertension  . Diabetes Mellitus II Mother   . Asthma Brother     BP 128/70 mmHg  Pulse 69  Temp(Src) 97.8 F (36.6 C) (Oral)  Ht 5\' 8"  (1.727 m)  Wt 133 lb (60.328 kg)  BMI 20.23 kg/m2  SpO2 95%  Review of Systems dtr says pt has memory loss.  Denies headache.  He has insomnia    Objective:   Physical Exam VITAL SIGNS:  See vs page GENERAL: no distress.   Gait: slow but steady.  PSYCH: Alert.  Oriented to self and place.  Remembers 0/3 at 5 minutes.  Does not appear anxious nor depressed.        Assessment & Plan:  Memory loss: mod exacerbation.  Pt declines CT scan.  Insomnia, new, prob related to dementia. Lightheadedness, new.  Family is advised this a long ddx, and that w/u should consider pt's advanced age.  They agree.  Patient is advised the following: Patient Instructions  A blood test is requested for you today.  We'll let you know about the results.   Here is a prescription, for sleep.  Please start with 1/2 pill.  You  can take 1 whole if necessary.   We can add a pill for the memory, but it is best to do 1 at a time. Please come back for a regular physical appointment in 1 month.

## 2014-03-31 NOTE — Patient Instructions (Addendum)
A blood test is requested for you today.  We'll let you know about the results.   Here is a prescription, for sleep.  Please start with 1/2 pill.  You can take 1 whole if necessary.   We can add a pill for the memory, but it is best to do 1 at a time. Please come back for a regular physical appointment in 1 month.

## 2014-04-01 LAB — VITAMIN B12: Vitamin B-12: 379 pg/mL (ref 211–911)

## 2014-04-10 ENCOUNTER — Other Ambulatory Visit: Payer: Self-pay | Admitting: Internal Medicine

## 2014-04-10 DIAGNOSIS — R0600 Dyspnea, unspecified: Secondary | ICD-10-CM

## 2014-04-13 ENCOUNTER — Ambulatory Visit: Payer: Medicare Other | Admitting: Internal Medicine

## 2014-05-11 ENCOUNTER — Ambulatory Visit: Payer: Self-pay | Admitting: Endocrinology

## 2014-07-15 ENCOUNTER — Telehealth: Payer: Self-pay | Admitting: Internal Medicine

## 2014-07-15 MED ORDER — FLUTICASONE FUROATE-VILANTEROL 100-25 MCG/INH IN AEPB: 1.0000 | INHALATION_SPRAY | Freq: Every morning | RESPIRATORY_TRACT | Status: AC

## 2014-07-15 NOTE — Telephone Encounter (Signed)
Spoke with pt's daughter, Danny [name]. Advised her that pt will need ROV with MW. This has been scheduled for 07/27/14 at 1:45pm. Rx has been sent in as well. Nothing further was needed.

## 2014-07-18 ENCOUNTER — Emergency Department (HOSPITAL_COMMUNITY): Payer: Medicare Other

## 2014-07-18 ENCOUNTER — Encounter (HOSPITAL_COMMUNITY): Payer: Self-pay | Admitting: Emergency Medicine

## 2014-07-18 ENCOUNTER — Inpatient Hospital Stay (HOSPITAL_COMMUNITY)
Admission: EM | Admit: 2014-07-18 | Discharge: 2014-08-12 | DRG: 871 | Disposition: E | Payer: Medicare Other | Attending: Internal Medicine | Admitting: Internal Medicine

## 2014-07-18 DIAGNOSIS — K760 Fatty (change of) liver, not elsewhere classified: Secondary | ICD-10-CM | POA: Diagnosis not present

## 2014-07-18 DIAGNOSIS — J449 Chronic obstructive pulmonary disease, unspecified: Secondary | ICD-10-CM | POA: Diagnosis not present

## 2014-07-18 DIAGNOSIS — I5023 Acute on chronic systolic (congestive) heart failure: Secondary | ICD-10-CM | POA: Diagnosis not present

## 2014-07-18 DIAGNOSIS — F0390 Unspecified dementia without behavioral disturbance: Secondary | ICD-10-CM | POA: Diagnosis present

## 2014-07-18 DIAGNOSIS — E870 Hyperosmolality and hypernatremia: Secondary | ICD-10-CM | POA: Diagnosis not present

## 2014-07-18 DIAGNOSIS — D649 Anemia, unspecified: Secondary | ICD-10-CM | POA: Diagnosis present

## 2014-07-18 DIAGNOSIS — J90 Pleural effusion, not elsewhere classified: Secondary | ICD-10-CM | POA: Diagnosis not present

## 2014-07-18 DIAGNOSIS — Z833 Family history of diabetes mellitus: Secondary | ICD-10-CM

## 2014-07-18 DIAGNOSIS — Z825 Family history of asthma and other chronic lower respiratory diseases: Secondary | ICD-10-CM | POA: Diagnosis not present

## 2014-07-18 DIAGNOSIS — E876 Hypokalemia: Secondary | ICD-10-CM | POA: Diagnosis not present

## 2014-07-18 DIAGNOSIS — E440 Moderate protein-calorie malnutrition: Secondary | ICD-10-CM | POA: Diagnosis present

## 2014-07-18 DIAGNOSIS — E860 Dehydration: Secondary | ICD-10-CM | POA: Diagnosis present

## 2014-07-18 DIAGNOSIS — I499 Cardiac arrhythmia, unspecified: Secondary | ICD-10-CM | POA: Diagnosis not present

## 2014-07-18 DIAGNOSIS — I4891 Unspecified atrial fibrillation: Secondary | ICD-10-CM | POA: Diagnosis present

## 2014-07-18 DIAGNOSIS — R7989 Other specified abnormal findings of blood chemistry: Secondary | ICD-10-CM | POA: Diagnosis not present

## 2014-07-18 DIAGNOSIS — T68XXXD Hypothermia, subsequent encounter: Secondary | ICD-10-CM | POA: Diagnosis not present

## 2014-07-18 DIAGNOSIS — R29810 Facial weakness: Secondary | ICD-10-CM | POA: Diagnosis not present

## 2014-07-18 DIAGNOSIS — N281 Cyst of kidney, acquired: Secondary | ICD-10-CM | POA: Diagnosis present

## 2014-07-18 DIAGNOSIS — K769 Liver disease, unspecified: Secondary | ICD-10-CM | POA: Diagnosis present

## 2014-07-18 DIAGNOSIS — R945 Abnormal results of liver function studies: Secondary | ICD-10-CM

## 2014-07-18 DIAGNOSIS — D696 Thrombocytopenia, unspecified: Secondary | ICD-10-CM | POA: Diagnosis not present

## 2014-07-18 DIAGNOSIS — K819 Cholecystitis, unspecified: Secondary | ICD-10-CM | POA: Diagnosis not present

## 2014-07-18 DIAGNOSIS — I129 Hypertensive chronic kidney disease with stage 1 through stage 4 chronic kidney disease, or unspecified chronic kidney disease: Secondary | ICD-10-CM | POA: Diagnosis present

## 2014-07-18 DIAGNOSIS — N179 Acute kidney failure, unspecified: Secondary | ICD-10-CM

## 2014-07-18 DIAGNOSIS — E1165 Type 2 diabetes mellitus with hyperglycemia: Secondary | ICD-10-CM | POA: Diagnosis not present

## 2014-07-18 DIAGNOSIS — I480 Paroxysmal atrial fibrillation: Secondary | ICD-10-CM | POA: Diagnosis not present

## 2014-07-18 DIAGNOSIS — Z4682 Encounter for fitting and adjustment of non-vascular catheter: Secondary | ICD-10-CM | POA: Diagnosis not present

## 2014-07-18 DIAGNOSIS — I517 Cardiomegaly: Secondary | ICD-10-CM | POA: Diagnosis not present

## 2014-07-18 DIAGNOSIS — Z7982 Long term (current) use of aspirin: Secondary | ICD-10-CM | POA: Diagnosis not present

## 2014-07-18 DIAGNOSIS — R188 Other ascites: Secondary | ICD-10-CM | POA: Diagnosis not present

## 2014-07-18 DIAGNOSIS — E1122 Type 2 diabetes mellitus with diabetic chronic kidney disease: Secondary | ICD-10-CM | POA: Diagnosis present

## 2014-07-18 DIAGNOSIS — E162 Hypoglycemia, unspecified: Secondary | ICD-10-CM | POA: Diagnosis present

## 2014-07-18 DIAGNOSIS — Z781 Physical restraint status: Secondary | ICD-10-CM | POA: Diagnosis not present

## 2014-07-18 DIAGNOSIS — D689 Coagulation defect, unspecified: Secondary | ICD-10-CM | POA: Diagnosis not present

## 2014-07-18 DIAGNOSIS — H5441 Blindness, right eye, normal vision left eye: Secondary | ICD-10-CM | POA: Diagnosis present

## 2014-07-18 DIAGNOSIS — I5022 Chronic systolic (congestive) heart failure: Secondary | ICD-10-CM | POA: Diagnosis present

## 2014-07-18 DIAGNOSIS — F17210 Nicotine dependence, cigarettes, uncomplicated: Secondary | ICD-10-CM | POA: Diagnosis present

## 2014-07-18 DIAGNOSIS — Z8249 Family history of ischemic heart disease and other diseases of the circulatory system: Secondary | ICD-10-CM

## 2014-07-18 DIAGNOSIS — N189 Chronic kidney disease, unspecified: Secondary | ICD-10-CM | POA: Diagnosis not present

## 2014-07-18 DIAGNOSIS — G9341 Metabolic encephalopathy: Secondary | ICD-10-CM | POA: Diagnosis not present

## 2014-07-18 DIAGNOSIS — I2510 Atherosclerotic heart disease of native coronary artery without angina pectoris: Secondary | ICD-10-CM | POA: Diagnosis not present

## 2014-07-18 DIAGNOSIS — E11649 Type 2 diabetes mellitus with hypoglycemia without coma: Secondary | ICD-10-CM | POA: Diagnosis not present

## 2014-07-18 DIAGNOSIS — E874 Mixed disorder of acid-base balance: Secondary | ICD-10-CM | POA: Diagnosis present

## 2014-07-18 DIAGNOSIS — E785 Hyperlipidemia, unspecified: Secondary | ICD-10-CM | POA: Diagnosis present

## 2014-07-18 DIAGNOSIS — J9601 Acute respiratory failure with hypoxia: Secondary | ICD-10-CM | POA: Diagnosis not present

## 2014-07-18 DIAGNOSIS — I482 Chronic atrial fibrillation: Secondary | ICD-10-CM | POA: Diagnosis not present

## 2014-07-18 DIAGNOSIS — K921 Melena: Secondary | ICD-10-CM | POA: Diagnosis not present

## 2014-07-18 DIAGNOSIS — J9690 Respiratory failure, unspecified, unspecified whether with hypoxia or hypercapnia: Secondary | ICD-10-CM | POA: Diagnosis not present

## 2014-07-18 DIAGNOSIS — N183 Chronic kidney disease, stage 3 (moderate): Secondary | ICD-10-CM | POA: Diagnosis present

## 2014-07-18 DIAGNOSIS — T68XXXA Hypothermia, initial encounter: Secondary | ICD-10-CM | POA: Diagnosis not present

## 2014-07-18 DIAGNOSIS — A419 Sepsis, unspecified organism: Principal | ICD-10-CM | POA: Diagnosis present

## 2014-07-18 DIAGNOSIS — R4182 Altered mental status, unspecified: Secondary | ICD-10-CM | POA: Diagnosis not present

## 2014-07-18 DIAGNOSIS — Z66 Do not resuscitate: Secondary | ICD-10-CM | POA: Diagnosis not present

## 2014-07-18 DIAGNOSIS — I472 Ventricular tachycardia: Secondary | ICD-10-CM | POA: Diagnosis not present

## 2014-07-18 DIAGNOSIS — R0602 Shortness of breath: Secondary | ICD-10-CM | POA: Diagnosis not present

## 2014-07-18 DIAGNOSIS — R6520 Severe sepsis without septic shock: Secondary | ICD-10-CM | POA: Diagnosis present

## 2014-07-18 DIAGNOSIS — R05 Cough: Secondary | ICD-10-CM | POA: Diagnosis not present

## 2014-07-18 DIAGNOSIS — J9811 Atelectasis: Secondary | ICD-10-CM

## 2014-07-18 DIAGNOSIS — R778 Other specified abnormalities of plasma proteins: Secondary | ICD-10-CM

## 2014-07-18 DIAGNOSIS — I4892 Unspecified atrial flutter: Secondary | ICD-10-CM | POA: Diagnosis present

## 2014-07-18 DIAGNOSIS — R64 Cachexia: Secondary | ICD-10-CM | POA: Diagnosis present

## 2014-07-18 DIAGNOSIS — E119 Type 2 diabetes mellitus without complications: Secondary | ICD-10-CM | POA: Diagnosis not present

## 2014-07-18 DIAGNOSIS — Z515 Encounter for palliative care: Secondary | ICD-10-CM

## 2014-07-18 DIAGNOSIS — Z4659 Encounter for fitting and adjustment of other gastrointestinal appliance and device: Secondary | ICD-10-CM

## 2014-07-18 DIAGNOSIS — I639 Cerebral infarction, unspecified: Secondary | ICD-10-CM | POA: Diagnosis not present

## 2014-07-18 DIAGNOSIS — R413 Other amnesia: Secondary | ICD-10-CM | POA: Diagnosis present

## 2014-07-18 DIAGNOSIS — J811 Chronic pulmonary edema: Secondary | ICD-10-CM | POA: Diagnosis not present

## 2014-07-18 DIAGNOSIS — J441 Chronic obstructive pulmonary disease with (acute) exacerbation: Secondary | ICD-10-CM | POA: Diagnosis present

## 2014-07-18 DIAGNOSIS — Z7951 Long term (current) use of inhaled steroids: Secondary | ICD-10-CM | POA: Diagnosis not present

## 2014-07-18 DIAGNOSIS — E1121 Type 2 diabetes mellitus with diabetic nephropathy: Secondary | ICD-10-CM | POA: Diagnosis present

## 2014-07-18 DIAGNOSIS — R109 Unspecified abdominal pain: Secondary | ICD-10-CM

## 2014-07-18 DIAGNOSIS — Z6820 Body mass index (BMI) 20.0-20.9, adult: Secondary | ICD-10-CM | POA: Diagnosis not present

## 2014-07-18 DIAGNOSIS — Z7189 Other specified counseling: Secondary | ICD-10-CM | POA: Diagnosis not present

## 2014-07-18 DIAGNOSIS — Z931 Gastrostomy status: Secondary | ICD-10-CM

## 2014-07-18 DIAGNOSIS — R479 Unspecified speech disturbances: Secondary | ICD-10-CM | POA: Diagnosis not present

## 2014-07-18 DIAGNOSIS — I10 Essential (primary) hypertension: Secondary | ICD-10-CM | POA: Diagnosis not present

## 2014-07-18 DIAGNOSIS — N289 Disorder of kidney and ureter, unspecified: Secondary | ICD-10-CM

## 2014-07-18 DIAGNOSIS — J9600 Acute respiratory failure, unspecified whether with hypoxia or hypercapnia: Secondary | ICD-10-CM | POA: Diagnosis not present

## 2014-07-18 DIAGNOSIS — N200 Calculus of kidney: Secondary | ICD-10-CM | POA: Diagnosis not present

## 2014-07-18 DIAGNOSIS — R059 Cough, unspecified: Secondary | ICD-10-CM

## 2014-07-18 LAB — PROTIME-INR
INR: 2.85 — ABNORMAL HIGH (ref 0.00–1.49)
Prothrombin Time: 30.1 seconds — ABNORMAL HIGH (ref 11.6–15.2)

## 2014-07-18 LAB — URINALYSIS, ROUTINE W REFLEX MICROSCOPIC
Bilirubin Urine: NEGATIVE
Glucose, UA: 100 mg/dL — AB
KETONES UR: NEGATIVE mg/dL
LEUKOCYTES UA: NEGATIVE
NITRITE: NEGATIVE
PH: 5 (ref 5.0–8.0)
Specific Gravity, Urine: 1.017 (ref 1.005–1.030)
Urobilinogen, UA: 1 mg/dL (ref 0.0–1.0)

## 2014-07-18 LAB — I-STAT ARTERIAL BLOOD GAS, ED
Acid-base deficit: 19 mmol/L — ABNORMAL HIGH (ref 0.0–2.0)
BICARBONATE: 7.1 meq/L — AB (ref 20.0–24.0)
O2 SAT: 97 %
TCO2: 8 mmol/L (ref 0–100)
pCO2 arterial: 19.7 mmHg — CL (ref 35.0–45.0)
pH, Arterial: 7.167 — CL (ref 7.350–7.450)
pO2, Arterial: 114 mmHg — ABNORMAL HIGH (ref 80.0–100.0)

## 2014-07-18 LAB — I-STAT CHEM 8, ED
BUN: 56 mg/dL — AB (ref 6–20)
CHLORIDE: 106 mmol/L (ref 101–111)
Calcium, Ion: 1.12 mmol/L — ABNORMAL LOW (ref 1.13–1.30)
Creatinine, Ser: 2.1 mg/dL — ABNORMAL HIGH (ref 0.61–1.24)
Glucose, Bld: <20 mg/dL — CL (ref 70–99)
HCT: 44 % (ref 39.0–52.0)
Hemoglobin: 15 g/dL (ref 13.0–17.0)
POTASSIUM: 4.7 mmol/L (ref 3.5–5.1)
SODIUM: 140 mmol/L (ref 135–145)
TCO2: 8 mmol/L (ref 0–100)

## 2014-07-18 LAB — DIFFERENTIAL
BASOS PCT: 0 % (ref 0–1)
Basophils Absolute: 0 10*3/uL (ref 0.0–0.1)
Eosinophils Absolute: 0 10*3/uL (ref 0.0–0.7)
Eosinophils Relative: 0 % (ref 0–5)
Lymphocytes Relative: 7 % — ABNORMAL LOW (ref 12–46)
Lymphs Abs: 0.5 10*3/uL — ABNORMAL LOW (ref 0.7–4.0)
MONO ABS: 0.6 10*3/uL (ref 0.1–1.0)
MONOS PCT: 9 % (ref 3–12)
Neutro Abs: 5.8 10*3/uL (ref 1.7–7.7)
Neutrophils Relative %: 84 % — ABNORMAL HIGH (ref 43–77)

## 2014-07-18 LAB — COMPREHENSIVE METABOLIC PANEL
ALT: <5 U/L — ABNORMAL LOW (ref 17–63)
AST: 55 U/L — ABNORMAL HIGH (ref 15–41)
Albumin: 3.5 g/dL (ref 3.5–5.0)
Alkaline Phosphatase: 75 U/L (ref 38–126)
BUN: 63 mg/dL — ABNORMAL HIGH (ref 6–20)
CHLORIDE: 100 mmol/L — AB (ref 101–111)
CO2: <5 mmol/L — ABNORMAL LOW (ref 22–32)
Calcium: 9.7 mg/dL (ref 8.9–10.3)
Creatinine, Ser: 2.61 mg/dL — ABNORMAL HIGH (ref 0.61–1.24)
GFR calc Af Amer: 23 mL/min — ABNORMAL LOW (ref >60)
GFR calc non Af Amer: 20 mL/min — ABNORMAL LOW (ref >60)
Glucose, Bld: <20 mg/dL — CL (ref 70–99)
POTASSIUM: 4.8 mmol/L (ref 3.5–5.1)
SODIUM: 138 mmol/L (ref 135–145)
TOTAL PROTEIN: 8.3 g/dL — AB (ref 6.5–8.1)
Total Bilirubin: 2.9 mg/dL — ABNORMAL HIGH (ref 0.3–1.2)

## 2014-07-18 LAB — CBC
HEMATOCRIT: 39.6 % (ref 39.0–52.0)
Hemoglobin: 12.8 g/dL — ABNORMAL LOW (ref 13.0–17.0)
MCH: 30.8 pg (ref 26.0–34.0)
MCHC: 32.3 g/dL (ref 30.0–36.0)
MCV: 95.4 fL (ref 78.0–100.0)
Platelets: 166 10*3/uL (ref 150–400)
RBC: 4.15 MIL/uL — ABNORMAL LOW (ref 4.22–5.81)
RDW: 15.2 % (ref 11.5–15.5)
WBC: 6.9 10*3/uL (ref 4.0–10.5)

## 2014-07-18 LAB — I-STAT TROPONIN, ED: Troponin i, poc: 0.47 ng/mL (ref 0.00–0.08)

## 2014-07-18 LAB — URINE MICROSCOPIC-ADD ON

## 2014-07-18 LAB — CBG MONITORING, ED
GLUCOSE-CAPILLARY: 189 mg/dL — AB (ref 70–99)
Glucose-Capillary: 17 mg/dL — CL (ref 70–99)
Glucose-Capillary: 186 mg/dL — ABNORMAL HIGH (ref 70–99)
Glucose-Capillary: 185 mg/dL — ABNORMAL HIGH (ref 70–99)

## 2014-07-18 LAB — PROCALCITONIN: PROCALCITONIN: 2.3 ng/mL

## 2014-07-18 LAB — APTT: APTT: 32 s (ref 24–37)

## 2014-07-18 LAB — RAPID URINE DRUG SCREEN, HOSP PERFORMED
Amphetamines: NOT DETECTED
Barbiturates: NOT DETECTED
Benzodiazepines: NOT DETECTED
Cocaine: NOT DETECTED
Opiates: NOT DETECTED
Tetrahydrocannabinol: NOT DETECTED

## 2014-07-18 LAB — TROPONIN I: Troponin I: 0.51 ng/mL (ref <0.031)

## 2014-07-18 LAB — BRAIN NATRIURETIC PEPTIDE

## 2014-07-18 LAB — ETHANOL: Alcohol, Ethyl (B): <5 mg/dL (ref <5)

## 2014-07-18 MED ORDER — DEXTROSE 5 % IV SOLN: 1.0000 g | Freq: Once | INTRAVENOUS | Status: DC

## 2014-07-18 MED ORDER — ONDANSETRON HCL 4 MG/2ML IJ SOLN: 4.0000 mg | Freq: Four times a day (QID) | INTRAMUSCULAR | Status: DC | PRN

## 2014-07-18 MED ORDER — FLUTICASONE FUROATE-VILANTEROL 100-25 MCG/INH IN AEPB: 1.0000 | INHALATION_SPRAY | Freq: Every morning | RESPIRATORY_TRACT | Status: DC

## 2014-07-18 MED ORDER — VANCOMYCIN HCL IN DEXTROSE 1-5 GM/200ML-% IV SOLN
1000.0000 mg | Freq: Once | INTRAVENOUS | Status: AC
  Administered 2014-07-19: 1000 mg via INTRAVENOUS
  Filled 2014-07-18: qty 200

## 2014-07-18 MED ORDER — PIPERACILLIN-TAZOBACTAM IN DEX 2-0.25 GM/50ML IV SOLN
2.2500 g | Freq: Four times a day (QID) | INTRAVENOUS | Status: DC
  Administered 2014-07-19 – 2014-07-25 (×25): 2.25 g via INTRAVENOUS
  Filled 2014-07-18 (×29): qty 50

## 2014-07-18 MED ORDER — PIPERACILLIN-TAZOBACTAM 3.375 G IVPB 30 MIN
3.3750 g | Freq: Once | INTRAVENOUS | Status: AC
  Administered 2014-07-18: 3.375 g via INTRAVENOUS
  Filled 2014-07-18: qty 50

## 2014-07-18 MED ORDER — LABETALOL HCL 5 MG/ML IV SOLN: 5.0000 mg | Freq: Once | INTRAVENOUS | Status: DC

## 2014-07-18 MED ORDER — LORAZEPAM 2 MG/ML IJ SOLN
0.5000 mg | Freq: Once | INTRAMUSCULAR | Status: AC
  Administered 2014-07-18: 0.5 mg via INTRAVENOUS

## 2014-07-18 MED ORDER — LORAZEPAM 2 MG/ML IJ SOLN
INTRAMUSCULAR | Status: AC
  Filled 2014-07-18: qty 1

## 2014-07-18 MED ORDER — ASPIRIN 81 MG PO CHEW: 81.0000 mg | CHEWABLE_TABLET | Freq: Every evening | ORAL | Status: DC

## 2014-07-18 MED ORDER — SODIUM CHLORIDE 0.9 % IV SOLN
INTRAVENOUS | Status: DC
  Administered 2014-07-18: 23:00:00 via INTRAVENOUS

## 2014-07-18 MED ORDER — SODIUM CHLORIDE 0.9 % IV BOLUS (SEPSIS)
500.0000 mL | Freq: Once | INTRAVENOUS | Status: AC
  Administered 2014-07-18: 500 mL via INTRAVENOUS

## 2014-07-18 MED ORDER — DM-GUAIFENESIN ER 30-600 MG PO TB12
1.0000 | ORAL_TABLET | Freq: Two times a day (BID) | ORAL | Status: DC | PRN
  Filled 2014-07-18: qty 1

## 2014-07-18 MED ORDER — DEXTROSE 50 % IV SOLN
1.0000 | Freq: Once | INTRAVENOUS | Status: AC
  Administered 2014-07-18: 50 mL via INTRAVENOUS

## 2014-07-18 MED ORDER — ONDANSETRON HCL 4 MG PO TABS: 4.0000 mg | ORAL_TABLET | Freq: Four times a day (QID) | ORAL | Status: DC | PRN

## 2014-07-18 MED ORDER — SODIUM CHLORIDE 0.9 % IV BOLUS (SEPSIS)
1000.0000 mL | Freq: Once | INTRAVENOUS | Status: AC
  Administered 2014-07-18: 1000 mL via INTRAVENOUS

## 2014-07-18 MED ORDER — METOPROLOL TARTRATE 1 MG/ML IV SOLN
5.0000 mg | Freq: Once | INTRAVENOUS | Status: DC
Start: 2014-07-18 — End: 2014-07-19
  Filled 2014-07-18: qty 5

## 2014-07-18 MED ORDER — VANCOMYCIN HCL IN DEXTROSE 1-5 GM/200ML-% IV SOLN
1000.0000 mg | INTRAVENOUS | Status: DC
  Administered 2014-07-20: 1000 mg via INTRAVENOUS
  Filled 2014-07-18 (×2): qty 200

## 2014-07-18 MED ORDER — HEPARIN SODIUM (PORCINE) 5000 UNIT/ML IJ SOLN: 5000.0000 [IU] | Freq: Three times a day (TID) | INTRAMUSCULAR | Status: DC

## 2014-07-18 NOTE — Progress Notes (Signed)
Code stroke called at 1621, Patient arrived to [facility] ED via [facility] at 1635.  On arrival to ED patient lethargic, gray and labored breathing.  Sats 95% as per EMS placed on [place], CBG checked at bridge 17, 1 amp d 50 given, PIV blown.  Patient brought to room to obtain PIV and recheck CBG.  2nd amp of d50 given, CBG 189.  Patient placed on NRB due to mouth breathing and shallow respirations.  Patient now more alert and color better.  LSN at 1100, NIHSS 9.

## 2014-07-18 NOTE — ED Notes (Signed)
IV restarted in Right forearm-- 2nd amp D50 given. Pt waking up, daughters at bedside., hx of prosthetic right eye.

## 2014-07-18 NOTE — ED Notes (Addendum)
To ED via [facility] from home with daughters stating that father was ok at 11am , laid down to nap, when he woke up approx 12 he was weak, had slurred speech, right sided facial droop. On arrival to bridge patient was slow to respond, Dr. Thad Rangereynolds and Dr. Jodi MourningZavitz at bedside. Pt CBG = 17 -- D50 1 amp given while at bridge-- in IV left forearm-- IV infiltrated after D50 given-- pt moved to rm A9 to stabilize further.

## 2014-07-18 NOTE — ED Notes (Signed)
Dr Adriana Simasook given a copy of chem 8 results

## 2014-07-18 NOTE — ED Notes (Signed)
Dr Adriana Simasook given a copy of troponin results .47

## 2014-07-18 NOTE — Consult Note (Signed)
Referring Physician: Adriana Simasook    Chief Complaint: Unresponsive, facial droop, slurred speech  HPI: Danny [name] is an 81 y.o. male who was home with family today in his usual state of health.  He laid down for a nap at 1100 which is unusual for him.  When his daughter attempted to awaken him he was poorly responsive and had slurred speech.  They noted a facial droop as well.  EMS was called at that time.  On their arrival the patient was in atrial flutter.  Cardizem was administered.  Code stroke was called in the field.  CBG on arrival was 17.    Date last known well: Date: 07/16/2014 Time last known well: Time: 11:00 tPA Given: No: Outside time window, coagulopathy  Past Medical History  Diagnosis Date  . DIABETES MELLITUS, TYPE II 12/06/2006  . NEPHROPATHY, DIABETIC 12/06/2006  . HYPERLIPIDEMIA 12/06/2006  . HYPOKALEMIA 01/12/2009  . TOBACCO USER 05/13/2009  . HYPERTENSION 12/06/2006  . CORONARY ATHEROSCLEROSIS, NATIVE VESSEL 05/13/2009  . Atrial fibrillation 05/04/2009  . COPD 12/06/2006  . GERD 12/06/2006  . DIVERTICULOSIS, COLON 12/06/2006  . CONSTIPATION 12/20/2007  . RECTAL BLEEDING 12/20/2007  . RENAL INSUFFICIENCY 12/20/2007  . BACK PAIN, LUMBAR 01/21/2008  . DIZZINESS OR VERTIGO 12/06/2006  . ABNORMAL EKG 05/13/2009  . Diastolic dysfunction   . Blindness of right eye     Past Surgical History  Procedure Laterality Date  . Eye surgery Bilateral     cataratcs    Family History  Problem Relation Age of Onset  . Cancer Neg Hx   . Hypertension Other     Significant for hypertension  . Diabetes Mellitus II Mother   . Asthma Brother    Social History:  reports that he has been smoking Cigarettes.  He has a 73 pack-year smoking history. He has never used smokeless tobacco. He reports that he does not drink alcohol or use illicit drugs.  Allergies: No Known Allergies  Medications: I have reviewed the patient's current medications. Prior to Admission:  Current outpatient  prescriptions:  .  albuterol (PROVENTIL HFA;VENTOLIN HFA) 108 (90 BASE) MCG/ACT inhaler, Inhale 1-2 puffs into the lungs every 6 (six) hours as needed for wheezing or shortness of breath., Disp: 1 Inhaler, Rfl: 0 .  aspirin 81 MG tablet, Take 81 mg by mouth every evening. , Disp: , Rfl:  .  Fluticasone Furoate-Vilanterol (BREO ELLIPTA) 100-25 MCG/INH AEPB, Inhale 1 puff into the lungs every morning., Disp: 60 each, Rfl: 0 .  zolpidem (AMBIEN) 5 MG tablet, Take 1 tablet (5 mg total) by mouth at bedtime as needed for sleep., Disp: 30 tablet, Rfl: 1  ROS: History obtained from the patient  General ROS: negative for - chills, fatigue, fever, night sweats, weight gain or weight loss Psychological ROS: negative for - behavioral disorder, hallucinations, memory difficulties, mood swings or suicidal ideation Ophthalmic ROS: negative for - blurry vision, double vision, eye pain or loss of vision ENT ROS: negative for - epistaxis, nasal discharge, oral lesions, sore throat, tinnitus or vertigo Allergy and Immunology ROS: negative for - hives or itchy/watery eyes Hematological and Lymphatic ROS: negative for - bleeding problems, bruising or swollen lymph nodes Endocrine ROS: negative for - galactorrhea, hair pattern changes, polydipsia/polyuria or temperature intolerance Respiratory ROS: negative for - cough, shortness of breath Cardiovascular ROS: negative for - chest pain, dyspnea on exertion, edema or irregular heartbeat Gastrointestinal ROS: negative for - abdominal pain, diarrhea, hematemesis, nausea/vomiting or stool incontinence Genito-Urinary ROS: negative  for - dysuria, hematuria, incontinence or urinary frequency/urgency Musculoskeletal ROS: negative for - joint swelling or muscular weakness Neurological ROS: as noted in HPI Dermatological ROS: negative for rash and skin lesion changes  Physical Examination: Blood pressure 146/80, pulse 127, temperature 95.3 F (35.2 C), temperature source  Rectal, resp. rate 10, SpO2 100 %.  HEENT-  Normocephalic, no lesions, without obvious abnormality.  Normal external eye and conjunctiva.  Normal TM's bilaterally.  Normal auditory canals and external ears. Normal external nose, mucus membranes and septum.  Normal pharynx. Cardiovascular- S1, S2 normal, pulses palpable throughout   Lungs- chest clear, no wheezing, rales, normal symmetric air entry Abdomen- soft, non-tender; bowel sounds normal; no masses,  no organomegaly Extremities- no edema Lymph-no adenopathy palpable Musculoskeletal-no joint tenderness, deformity or swelling Skin-warm and dry, no hyperpigmentation, vitiligo, or suspicious lesions. Dusky appearance  Neurological Examination Mental Status: Lethargic.  Poorly responsive.  Speech mumbled and minimal.  Follows simple commands.  Cranial Nerves: II: Discs flat bilaterally; Poor vision, prosthetic eye. III,IV, VI: ptosis not present, extra-ocular motions intact bilaterally V,VII: right facial droop, facial light touch sensation normal bilaterally VIII: hearing normal bilaterally IX,X: gag reflex present XI: bilateral shoulder shrug XII: midline tongue extension Motor: Able to lift both arms with shoulder restriction noted on lifting the left upper extremity.  Unable to lift his legs off the bed.    Sensory: Altered sensation on the left upper and lower extremity Deep Tendon Reflexes: 2+ in the upper extremities and absent in the lower extremities Plantars: Right: upgoing   Left: upgoing Cerebellar: Normal finger-to-nose testing bilaterally.  Heel-to-shin testing unable to be performed due to leg weakness Gait: not tested due to weakness   Laboratory Studies:  Basic Metabolic Panel:  Recent Labs Lab Jul 20, 2014 1639 20-Jul-2014 1645  NA PENDING 140  K 4.8 4.7  CL PENDING 106  CO2 PENDING  --   GLUCOSE PENDING <20*  BUN 63* 56*  CREATININE 2.61* 2.10*  CALCIUM 9.7  --     Liver Function Tests:  Recent  Labs Lab 07/20/2014 1639  AST PENDING  ALT PENDING  ALKPHOS 75  BILITOT 2.9*  PROT 8.3*  ALBUMIN 3.5   No results for input(s): LIPASE, AMYLASE in the last 168 hours. No results for input(s): AMMONIA in the last 168 hours.  CBC:  Recent Labs Lab 07-20-2014 1639 07/20/14 1645  WBC 6.9  --   NEUTROABS 5.8  --   HGB 12.8* 15.0  HCT 39.6 44.0  MCV 95.4  --   PLT 166  --     Cardiac Enzymes: No results for input(s): CKTOTAL, CKMB, CKMBINDEX, TROPONINI in the last 168 hours.  BNP: Invalid input(s): POCBNP  CBG:  Recent Labs Lab Jul 20, 2014 1640 Jul 20, 2014 1654 07-20-14 1701  GLUCAP 17* 189* 186*    Microbiology: No results found for this or any previous visit.  Coagulation Studies:  Recent Labs  2014-07-20 1639  LABPROT 30.1*  INR 2.85*    Urinalysis: No results for input(s): COLORURINE, LABSPEC, PHURINE, GLUCOSEU, HGBUR, BILIRUBINUR, KETONESUR, PROTEINUR, UROBILINOGEN, NITRITE, LEUKOCYTESUR in the last 168 hours.  Invalid input(s): APPERANCEUR  Lipid Panel:    Component Value Date/Time   CHOL 131 01/11/2012 1029   TRIG 106 01/11/2012 1029   HDL 36* 01/11/2012 1029   CHOLHDL 3.6 01/11/2012 1029   VLDL 21 01/11/2012 1029   LDLCALC 74 01/11/2012 1029    HgbA1C:  Lab Results  Component Value Date   HGBA1C 6.0* 02/25/2014    Urine Drug  Screen:  No results found for: LABOPIA, COCAINSCRNUR, LABBENZ, AMPHETMU, THCU, LABBARB  Alcohol Level:   Recent Labs Lab 07/17/2014 1639  ETH <5    Other results: EKG: atrial tachycardia at 124 bpm  Imaging: Ct Head Wo Contrast  08/10/2014   CLINICAL DATA:  Code stroke. Right-sided facial droop in unresponsiveness.  EXAM: CT HEAD WITHOUT CONTRAST  TECHNIQUE: Contiguous axial images were obtained from the base of the skull through the vertex without intravenous contrast.  COMPARISON:  None.  FINDINGS: Asymmetric CSF space over the left parietal lobe near the vertex measures approximately 3 cm and is somewhat ovoid in  shape, possibly an incidental arachnoid cyst. There is moderate cerebral atrophy. There is no evidence of acute cortical infarct, intracranial hemorrhage, mass, midline shift, or extra-axial fluid collection. Patchy hypodensities in the subcortical and deep cerebral white matter bilaterally are nonspecific but compatible with mild chronic small vessel ischemic disease. Small, chronic infarcts are noted in the cerebellum bilaterally. There is also a punctate, 3 mm focus of hypoattenuation in the left thalamus compatible with a lacunar infarct.  Prior left cataract extraction and a right ocular prosthesis are noted. Cerumen is noted in the left EAC. Mastoid air cells are clear. Mild paranasal sinus mucosal thickening is noted bilaterally. There is moderate calcified atherosclerosis at the skull base.  IMPRESSION: 1. No evidence of acute intracranial hemorrhage or acute large territory infarct. 2. Small infarcts in the cerebellum which appear chronic. Punctate lacunar infarct in the left thalamus of indeterminate age. 3. Mild chronic small vessel ischemic disease and moderate cerebral atrophy.  These results were called by telephone at the time of interpretation on 07/15/2014 at 5:30 pm to Dr. Thad Rangereynolds, who verbally acknowledged these results.   Electronically Signed   By: Sebastian AcheAllen  Grady   On: 08/05/2014 17:31    Assessment: 81 y.o. male presenting with hypoglycemia and facial droop, slurred speech.  Patient short of breath as well.  On arrival to the bridge patient given 1 amp of D50.  IV was replaced and another amp of D50 given with BS response to 189.  Patient showed improvement at that time.  Had some continued facial droop.  Due to blood sugar and IV access issues head CT was delayed.  CT personally reviewed once performed and shows no acute changes.  Patient on ASA at home.    Stroke Risk Factors - atrial fibrillation, diabetes mellitus, hyperlipidemia, hypertension and smoking  Plan: 1. Continue ASA  daily 2. Troponin elevated and patient tachycardic.  Patient to be ruled out for MI 3. Patient NPO until nurse swallow screen performed.   4. Hypoglycemia to be investigated.     Case discussed with Dr. Abigail Miyamotoook  Dani Wallner, MD [facility] [phone] 07/22/2014, 5:51 PM

## 2014-07-18 NOTE — ED Provider Notes (Signed)
CSN: [id number]     Arrival date & time 07/15/2014  1635 History   First MD Initiated Contact with Patient 07/22/2014 1643     No chief complaint on file.   @[email or web address] @ (Consider location/radiation/quality/duration/timing/severity/associated sxs/prior Treatment) HPI..... Level V caveat for urgent need for intervention and altered mental status. Patient lives at home with his daughters. He has multiple health problems well documented in past medical history. He was last seen normal at approximately 1:30 PM by his daughter. He presents with altered mental status, confusion, facial drooping, weakness.  Initial glucose in the emergency department was less than 20. Family says he has not been ill lately. Code stroke called initially   Past Medical History  Diagnosis Date  . DIABETES MELLITUS, TYPE II 12/06/2006  . NEPHROPATHY, DIABETIC 12/06/2006  . HYPERLIPIDEMIA 12/06/2006  . HYPOKALEMIA 01/12/2009  . TOBACCO USER 05/13/2009  . HYPERTENSION 12/06/2006  . CORONARY ATHEROSCLEROSIS, NATIVE VESSEL 05/13/2009  . Atrial fibrillation 05/04/2009  . COPD 12/06/2006  . GERD 12/06/2006  . DIVERTICULOSIS, COLON 12/06/2006  . CONSTIPATION 12/20/2007  . RECTAL BLEEDING 12/20/2007  . RENAL INSUFFICIENCY 12/20/2007  . BACK PAIN, LUMBAR 01/21/2008  . DIZZINESS OR VERTIGO 12/06/2006  . ABNORMAL EKG 05/13/2009  . Diastolic dysfunction   . Blindness of right eye    Past Surgical History  Procedure Laterality Date  . Eye surgery Bilateral     cataratcs   Family History  Problem Relation Age of Onset  . Cancer Neg Hx   . Hypertension Other     Significant for hypertension  . Diabetes Mellitus II Mother   . Asthma Brother    History  Substance Use Topics  . Smoking status: Current Every Day Smoker -- 1.00 packs/day for 73 years    Types: Cigarettes  . Smokeless tobacco: Never Used  . Alcohol Use: No    Review of Systems  Unable to perform ROS: Acuity of condition      Allergies  Review of patient's  allergies indicates no known allergies.  Home Medications   Prior to Admission medications   Medication Sig Start Date End Date Taking? Authorizing Provider  albuterol (PROVENTIL HFA;VENTOLIN HFA) 108 (90 BASE) MCG/ACT inhaler Inhale 1-2 puffs into the lungs every 6 (six) hours as needed for wheezing or shortness of breath. 02/20/14   Marisa Severinlga Otter, MD  aspirin 81 MG tablet Take 81 mg by mouth every evening.     Historical Provider, MD  Fluticasone Furoate-Vilanterol (BREO ELLIPTA) 100-25 MCG/INH AEPB Inhale 1 puff into the lungs every morning. 07/15/14   Nyoka CowdenMichael B Wert, MD  zolpidem (AMBIEN) 5 MG tablet Take 1 tablet (5 mg total) by mouth at bedtime as needed for sleep. 03/31/14   Romero BellingSean Ellison, MD   BP 146/80 mmHg  Pulse 127  Temp(Src) 95.3 F (35.2 C) (Rectal)  Resp 10  SpO2 100% Physical Exam  Constitutional:  Mumbles answers to questions  HENT:  Head: Normocephalic and atraumatic.  Eyes: Conjunctivae are normal. Pupils are equal, round, and reactive to light.  Neck: Normal range of motion. Neck supple.  Cardiovascular: Regular rhythm.   Tachycardic  Pulmonary/Chest: Effort normal and breath sounds normal.  Abdominal: Soft. Bowel sounds are normal.  Musculoskeletal:  Unable  Neurological:  Patient attempts to move all 4 extremities to commands  Skin: Skin is warm and dry.  Psychiatric:  Unable  Nursing note and vitals reviewed.   ED Course  Procedures (including critical care time) Labs Review Labs Reviewed  PROTIME-INR - Abnormal; Notable  for the following:    Prothrombin Time 30.1 (*)    INR 2.85 (*)    All other components within normal limits  CBC - Abnormal; Notable for the following:    RBC 4.15 (*)    Hemoglobin 12.8 (*)    All other components within normal limits  DIFFERENTIAL - Abnormal; Notable for the following:    Neutrophils Relative % 84 (*)    Lymphocytes Relative 7 (*)    Lymphs Abs 0.5 (*)    All other components within normal limits   COMPREHENSIVE METABOLIC PANEL - Abnormal; Notable for the following:    BUN 63 (*)    Creatinine, Ser 2.61 (*)    Total Protein 8.3 (*)    Total Bilirubin 2.9 (*)    GFR calc non Af Amer 20 (*)    GFR calc Af Amer 23 (*)    All other components within normal limits  I-STAT CHEM 8, ED - Abnormal; Notable for the following:    BUN 56 (*)    Creatinine, Ser 2.10 (*)    Glucose, Bld <20 (*)    Calcium, Ion 1.12 (*)    All other components within normal limits  I-STAT TROPOININ, ED - Abnormal; Notable for the following:    Troponin i, poc 0.47 (*)    All other components within normal limits  CBG MONITORING, ED - Abnormal; Notable for the following:    Glucose-Capillary 17 (*)    All other components within normal limits  CBG MONITORING, ED - Abnormal; Notable for the following:    Glucose-Capillary 189 (*)    All other components within normal limits  CBG MONITORING, ED - Abnormal; Notable for the following:    Glucose-Capillary 186 (*)    All other components within normal limits  CULTURE, BLOOD (ROUTINE X 2)  CULTURE, BLOOD (ROUTINE X 2)  ETHANOL  APTT  URINE RAPID DRUG SCREEN (HOSP PERFORMED)  URINALYSIS, ROUTINE W REFLEX MICROSCOPIC    Imaging Review Ct Head Wo Contrast  08/03/2014   CLINICAL DATA:  Code stroke. Right-sided facial droop in unresponsiveness.  EXAM: CT HEAD WITHOUT CONTRAST  TECHNIQUE: Contiguous axial images were obtained from the base of the skull through the vertex without intravenous contrast.  COMPARISON:  None.  FINDINGS: Asymmetric CSF space over the left parietal lobe near the vertex measures approximately 3 cm and is somewhat ovoid in shape, possibly an incidental arachnoid cyst. There is moderate cerebral atrophy. There is no evidence of acute cortical infarct, intracranial hemorrhage, mass, midline shift, or extra-axial fluid collection. Patchy hypodensities in the subcortical and deep cerebral white matter bilaterally are nonspecific but compatible with  mild chronic small vessel ischemic disease. Small, chronic infarcts are noted in the cerebellum bilaterally. There is also a punctate, 3 mm focus of hypoattenuation in the left thalamus compatible with a lacunar infarct.  Prior left cataract extraction and a right ocular prosthesis are noted. Cerumen is noted in the left EAC. Mastoid air cells are clear. Mild paranasal sinus mucosal thickening is noted bilaterally. There is moderate calcified atherosclerosis at the skull base.  IMPRESSION: 1. No evidence of acute intracranial hemorrhage or acute large territory infarct. 2. Small infarcts in the cerebellum which appear chronic. Punctate lacunar infarct in the left thalamus of indeterminate age. 3. Mild chronic small vessel ischemic disease and moderate cerebral atrophy.  These results were called by telephone at the time of interpretation on 07/27/2014 at 5:30 pm to Dr. Thad Rangereynolds, who verbally acknowledged these results.  Electronically Signed   By: Sebastian Ache   On: Aug 10, 2014 17:31     EKG Interpretation   Date/Time:  Saturday Aug 10, 2014 16:53:40 EDT Ventricular Rate:  124 PR Interval:  78 QRS Duration: 141 QT Interval:  386 QTC Calculation: 554 R Axis:   -74 Text Interpretation:  Sinus or ectopic atrial tachycardia Ventricular  premature complex Right bundle branch block Probable anterior infarct, age  indeterminate Confirmed by Toniesha Zellner  MD, Degan Hanser ([id number]) on Aug 10, 2014 5:05:25 PM     CRITICAL CARE Performed by: Donnetta Hutching Total critical care time: 40 Critical care time was exclusive of separately billable procedures and treating other patients. Critical care was necessary to treat or prevent imminent or life-threatening deterioration. Critical care was time spent personally by me on the following activities: development of treatment plan with patient and/or surrogate as well as nursing, discussions with consultants, evaluation of patient's response to treatment, examination of patient, obtaining  history from patient or surrogate, ordering and performing treatments and interventions, ordering and review of laboratory studies, ordering and review of radiographic studies, pulse oximetry and re-evaluation of patient's condition. MDM   Final diagnoses:  Elevated troponin  Altered mental status, unspecified altered mental status type  Renal insufficiency  Hypothermia, initial encounter    Patient is hypothermic and tachycardic. CT head shows no acute hemorrhage or infarct. Troponin noted to be elevated at 0.47. Patient is tachycardic at 124. Creatinine 2.61.   Neurologist at bedside. IV glucose given. Patient seemed to respond positively to this. Bear hugger applied. IV fluids started. IV Rocephin started for presumptive urinary infection.    Donnetta Hutching, MD 10-Aug-2014 [phone]

## 2014-07-18 NOTE — Progress Notes (Signed)
ANTIBIOTIC CONSULT NOTE - INITIAL  Pharmacy Consult for Vancocin and Zosyn Indication: rule out sepsis  No Known Allergies  Patient Measurements: Height: 5\' 8"  (172.7 cm) Weight: 132 lb 15 oz (60.3 kg) IBW/kg (Calculated) : 68.4  Vital Signs: Temp: 95 F (35 C) (05/07 1830) Temp Source: Rectal (05/07 1734) BP: 140/87 mmHg (05/07 1945) Pulse Rate: 130 (05/07 1945)  Labs:  Recent Labs  05/29/2014 1639 05/29/2014 1645  WBC 6.9  --   HGB 12.8* 15.0  PLT 166  --   CREATININE 2.61* 2.10*   Estimated Creatinine Clearance: 19.5 mL/min (by C-G formula based on Cr of 2.1).  Medical History: Past Medical History  Diagnosis Date  . DIABETES MELLITUS, TYPE II 12/06/2006  . NEPHROPATHY, DIABETIC 12/06/2006  . HYPERLIPIDEMIA 12/06/2006  . HYPOKALEMIA 01/12/2009  . TOBACCO USER 05/13/2009  . HYPERTENSION 12/06/2006  . CORONARY ATHEROSCLEROSIS, NATIVE VESSEL 05/13/2009  . Atrial fibrillation 05/04/2009  . COPD 12/06/2006  . GERD 12/06/2006  . DIVERTICULOSIS, COLON 12/06/2006  . CONSTIPATION 12/20/2007  . RECTAL BLEEDING 12/20/2007  . RENAL INSUFFICIENCY 12/20/2007  . BACK PAIN, LUMBAR 01/21/2008  . DIZZINESS OR VERTIGO 12/06/2006  . ABNORMAL EKG 05/13/2009  . Diastolic dysfunction   . Blindness of right eye     Medications:  Prescriptions prior to admission  Medication Sig Dispense Refill Last Dose  . albuterol (PROVENTIL HFA;VENTOLIN HFA) 108 (90 BASE) MCG/ACT inhaler Inhale 1-2 puffs into the lungs every 6 (six) hours as needed for wheezing or shortness of breath. 1 Inhaler 0 Past Week at Unknown time  . aspirin 81 MG tablet Take 81 mg by mouth every evening.    07/29/2014 at Unknown time  . dextromethorphan-guaiFENesin (MUCINEX DM) 30-600 MG per 12 hr tablet Take 1 tablet by mouth 2 (two) times daily as needed for cough.   07/27/2014 at Unknown time  . Fluticasone Furoate-Vilanterol (BREO ELLIPTA) 100-25 MCG/INH AEPB Inhale 1 puff into the lungs every morning. 60 each 0 08/03/2014 at Unknown time   . furosemide (LASIX) 20 MG tablet Take 20 mg by mouth daily.   07/19/2014 at Unknown time  . zolpidem (AMBIEN) 5 MG tablet Take 1 tablet (5 mg total) by mouth at bedtime as needed for sleep. (Patient not taking: Reported on 07/24/2014) 30 tablet 1 Not Taking at Unknown time   Scheduled:  . aspirin  81 mg Oral QPM  . [START ON 07/19/2014] Fluticasone Furoate-Vilanterol  1 puff Inhalation q morning - 10a  . heparin  5,000 Units Subcutaneous 3 times per day  . LORazepam  0.5 mg Intravenous Once  . metoprolol  5 mg Intravenous Once  . piperacillin-tazobactam  3.375 g Intravenous Once  . vancomycin  1,000 mg Intravenous Once   Infusions:  . sodium chloride 50 mL/hr at 05/29/2014 2249    Assessment: 81yo male presents initially as code stroke w/ facial droop and slurred speech after awakening from nap, no acute changes on CT, found to be hypoglycemic and hypothermic, concern for sepsis w/ metabolic encephalopathy, to begin IV ABX.  Goal of Therapy:  Vancomycin trough level 15-20 mcg/ml  Plan:  Will begin vancomycin 1000mg  IV Q48H and Zosyn 2.25g IV Q6H and monitor CBC, Cx, levels prn.  Vernard GamblesVeronda Cydnie Deason, PharmD, BCPS  07/30/2014,10:52 PM

## 2014-07-18 NOTE — ED Notes (Signed)
Report attempted 

## 2014-07-18 NOTE — H&P (Signed)
PCP:   Romero BellingELLISON, SEAN, MD   Chief Complaint:  confusion  HPI: 81 yo male h/o systolic chf, CAD, COPD, tobacco abuse, lives at home with wife highly functional comes in with daughters for confusion today.   Over the last several days he has been saying he "doesnt feel well".  No n/v/d.  Coughing more than normal.  Not eating or drinking well.   No swelling or weight gain.  No report of fever.  No complaints of pain.  Today when family checked on him, he was very confused.  Came to ED as code stroke.  Pt not able to provide history due to confusion.  Found to have glucose of 17 (not on any diabetic meds at home, h/o dm but diet controlled per chart), temp of 95, tachypneic and tachycardic with good oxgyen sats and blood pressure.  Given amp of d50 with some improvement of his mental status, and glucose now over 150.  Pt refusing foley catheter.  Pt was just moved around for xray and is in moderate respiratory distress (was not this bad 10 minutes ago per family and RN).  Given one liter of ivf bolus in ED along with rocephin empirically.  Not on abx frequently and no recent hospitalizations.  No sick contacts.  Review of Systems:  Unobtainable from patient  Past Medical History: Past Medical History  Diagnosis Date  . DIABETES MELLITUS, TYPE II 12/06/2006  . NEPHROPATHY, DIABETIC 12/06/2006  . HYPERLIPIDEMIA 12/06/2006  . HYPOKALEMIA 01/12/2009  . TOBACCO USER 05/13/2009  . HYPERTENSION 12/06/2006  . CORONARY ATHEROSCLEROSIS, NATIVE VESSEL 05/13/2009  . Atrial fibrillation 05/04/2009  . COPD 12/06/2006  . GERD 12/06/2006  . DIVERTICULOSIS, COLON 12/06/2006  . CONSTIPATION 12/20/2007  . RECTAL BLEEDING 12/20/2007  . RENAL INSUFFICIENCY 12/20/2007  . BACK PAIN, LUMBAR 01/21/2008  . DIZZINESS OR VERTIGO 12/06/2006  . ABNORMAL EKG 05/13/2009  . Diastolic dysfunction   . Blindness of right eye    Past Surgical History  Procedure Laterality Date  . Eye surgery Bilateral     cataratcs     Medications: Prior to Admission medications   Medication Sig Start Date End Date Taking? Authorizing Provider  albuterol (PROVENTIL HFA;VENTOLIN HFA) 108 (90 BASE) MCG/ACT inhaler Inhale 1-2 puffs into the lungs every 6 (six) hours as needed for wheezing or shortness of breath. 02/20/14  Yes Marisa Severinlga Otter, MD  aspirin 81 MG tablet Take 81 mg by mouth every evening.    Yes Historical Provider, MD  dextromethorphan-guaiFENesin (MUCINEX DM) 30-600 MG per 12 hr tablet Take 1 tablet by mouth 2 (two) times daily as needed for cough.   Yes Historical Provider, MD  Fluticasone Furoate-Vilanterol (BREO ELLIPTA) 100-25 MCG/INH AEPB Inhale 1 puff into the lungs every morning. 07/15/14  Yes Nyoka CowdenMichael B Wert, MD  furosemide (LASIX) 20 MG tablet Take 20 mg by mouth daily.   Yes Historical Provider, MD  zolpidem (AMBIEN) 5 MG tablet Take 1 tablet (5 mg total) by mouth at bedtime as needed for sleep. Patient not taking: Reported on 07/25/2014 03/31/14   Romero BellingSean Ellison, MD    Allergies:  No Known Allergies  Social History:  reports that he has been smoking Cigarettes.  He has a 73 pack-year smoking history. He has never used smokeless tobacco. He reports that he does not drink alcohol or use illicit drugs.  Family History: Family History  Problem Relation Age of Onset  . Cancer Neg Hx   . Hypertension Other     Significant for  hypertension  . Diabetes Mellitus II Mother   . Asthma Brother     Physical Exam: Filed Vitals:   07/14/2014 1715 07/14/2014 1734 07/17/2014 1745 07/31/2014 1800  BP: 164/87  158/82 163/96  Pulse:   129 93  Temp:  95.3 F (35.2 C)    TempSrc:  Rectal    Resp:   30 34  SpO2:   100% 93%   General appearance: alert, moderate distress and slowed mentation Head: Normocephalic, without obvious abnormality, atraumatic Eyes: negative Nose: Nares normal. Septum midline. Mucosa normal. No drainage or sinus tenderness. Neck: no JVD and supple, symmetrical, trachea midline Lungs: no w/rales.   some rhonchi Heart: tachycardic RR no m/r/g Abdomen: soft, non-tender; bowel sounds normal; no masses,  no organomegaly Extremities: extremities normal, atraumatic, no cyanosis or edema Pulses: 2+ and symmetric Skin: Skin color, texture, turgor normal. No rashes or lesions Neurologic: Mental status: alertness: lethargic, orientation: none Cranial nerves: normal MAE, does not follow simple commands   Labs on Admission:   Recent Labs  07/12/2014 1639 07/25/2014 1645  NA 138 140  K 4.8 4.7  CL 100* 106  CO2 <5*  --   GLUCOSE <20* <20*  BUN 63* 56*  CREATININE 2.61* 2.10*  CALCIUM 9.7  --     Recent Labs  08/11/2014 1639  AST 55*  ALT <5*  ALKPHOS 75  BILITOT 2.9*  PROT 8.3*  ALBUMIN 3.5    Recent Labs  07/17/2014 1639 07/15/2014 1645  WBC 6.9  --   NEUTROABS 5.8  --   HGB 12.8* 15.0  HCT 39.6 44.0  MCV 95.4  --   PLT 166  --    Radiological Exams on Admission: Ct Head Wo Contrast  07/26/2014   CLINICAL DATA:  Code stroke. Right-sided facial droop in unresponsiveness.  EXAM: CT HEAD WITHOUT CONTRAST  TECHNIQUE: Contiguous axial images were obtained from the base of the skull through the vertex without intravenous contrast.  COMPARISON:  None.  FINDINGS: Asymmetric CSF space over the left parietal lobe near the vertex measures approximately 3 cm and is somewhat ovoid in shape, possibly an incidental arachnoid cyst. There is moderate cerebral atrophy. There is no evidence of acute cortical infarct, intracranial hemorrhage, mass, midline shift, or extra-axial fluid collection. Patchy hypodensities in the subcortical and deep cerebral white matter bilaterally are nonspecific but compatible with mild chronic small vessel ischemic disease. Small, chronic infarcts are noted in the cerebellum bilaterally. There is also a punctate, 3 mm focus of hypoattenuation in the left thalamus compatible with a lacunar infarct.  Prior left cataract extraction and a right ocular prosthesis are noted.  Cerumen is noted in the left EAC. Mastoid air cells are clear. Mild paranasal sinus mucosal thickening is noted bilaterally. There is moderate calcified atherosclerosis at the skull base.  IMPRESSION: 1. No evidence of acute intracranial hemorrhage or acute large territory infarct. 2. Small infarcts in the cerebellum which appear chronic. Punctate lacunar infarct in the left thalamus of indeterminate age. 3. Mild chronic small vessel ischemic disease and moderate cerebral atrophy.  These results were called by telephone at the time of interpretation on 07/12/2014 at 5:30 pm to Dr. Thad Ranger, who verbally acknowledged these results.   Electronically Signed   By: Sebastian Ache   On: 08/09/2014 17:31    Assessment/Plan  81 yo male with metabolic encephalopathy, aki, hypoglycemia, hypothermia and mildly elevated trop  Principal Problem:   Metabolic encephalopathy- in setting of hypoglycemia and hypothermia concern is for sepsis.  Lactic acid level is pending.  Place on sepsis protocol and empirically cover with broad abx vancomycin and zosyn.  Blood cx obtained.  ua is pending.  cxr unrevealing.  Admit to stepdown unit.  Active Problems:   CORONARY ATHEROSCLEROSIS, NATIVE VESSEL   Atrial fibrillation   Elevated troponin-  Serial trop overnight.  Ck echo in am.  May be elevated due to renal dysfunction or demand ischemia due to acute illness   Systolic CHF, chronic-  Last EF around 50%.  On daily lasix at home.  Gentle ivf tonight.  Try to place foley catheter.   Memory loss   Hypoglycemia-  Hourly glucose checks in stepdown.   Hypothermia-  HerbalistBarehugger.  Monitor temp.   AKI (acute kidney injury)-  Gentle ivf, hold ace inhibitor and diuretics.  Ck urine studies and renal ultrasound in am.  Admit to stepdown.  Family considering code status change to DNR, full code for now.  Wife is at home and not at bedside.  She is also highly functional.  Suspect pt may deteriorate tonight and need intubation, family is  aware of this and the critical nature of his current condition.   DAVID,RACHAL A 07/27/2014, 7:08 PM

## 2014-07-19 ENCOUNTER — Inpatient Hospital Stay (HOSPITAL_COMMUNITY): Payer: Medicare Other

## 2014-07-19 DIAGNOSIS — I5022 Chronic systolic (congestive) heart failure: Secondary | ICD-10-CM

## 2014-07-19 DIAGNOSIS — T68XXXS Hypothermia, sequela: Secondary | ICD-10-CM

## 2014-07-19 DIAGNOSIS — G9341 Metabolic encephalopathy: Secondary | ICD-10-CM

## 2014-07-19 DIAGNOSIS — N189 Chronic kidney disease, unspecified: Secondary | ICD-10-CM

## 2014-07-19 DIAGNOSIS — T68XXXD Hypothermia, subsequent encounter: Secondary | ICD-10-CM

## 2014-07-19 DIAGNOSIS — R4182 Altered mental status, unspecified: Secondary | ICD-10-CM

## 2014-07-19 DIAGNOSIS — I482 Chronic atrial fibrillation: Secondary | ICD-10-CM

## 2014-07-19 DIAGNOSIS — N179 Acute kidney failure, unspecified: Secondary | ICD-10-CM | POA: Insufficient documentation

## 2014-07-19 DIAGNOSIS — E162 Hypoglycemia, unspecified: Secondary | ICD-10-CM

## 2014-07-19 LAB — CBC WITH DIFFERENTIAL/PLATELET
BASOS ABS: 0 10*3/uL (ref 0.0–0.1)
Basophils Relative: 0 % (ref 0–1)
Eosinophils Absolute: 0 10*3/uL (ref 0.0–0.7)
Eosinophils Relative: 0 % (ref 0–5)
HEMATOCRIT: 36.7 % — AB (ref 39.0–52.0)
Hemoglobin: 11.6 g/dL — ABNORMAL LOW (ref 13.0–17.0)
Lymphocytes Relative: 3 % — ABNORMAL LOW (ref 12–46)
Lymphs Abs: 0.3 10*3/uL — ABNORMAL LOW (ref 0.7–4.0)
MCH: 30.6 pg (ref 26.0–34.0)
MCHC: 31.6 g/dL (ref 30.0–36.0)
MCV: 96.8 fL (ref 78.0–100.0)
Monocytes Absolute: 1.7 10*3/uL — ABNORMAL HIGH (ref 0.1–1.0)
Monocytes Relative: 15 % — ABNORMAL HIGH (ref 3–12)
Neutro Abs: 9 10*3/uL — ABNORMAL HIGH (ref 1.7–7.7)
Neutrophils Relative %: 82 % — ABNORMAL HIGH (ref 43–77)
Platelets: 120 10*3/uL — ABNORMAL LOW (ref 150–400)
RBC: 3.79 MIL/uL — ABNORMAL LOW (ref 4.22–5.81)
RDW: 15.4 % (ref 11.5–15.5)
WBC: 11.1 10*3/uL — ABNORMAL HIGH (ref 4.0–10.5)

## 2014-07-19 LAB — HEPATIC FUNCTION PANEL
ALT: 110 U/L — ABNORMAL HIGH (ref 17–63)
AST: 388 U/L — ABNORMAL HIGH (ref 15–41)
Albumin: 2.8 g/dL — ABNORMAL LOW (ref 3.5–5.0)
Alkaline Phosphatase: 63 U/L (ref 38–126)
BILIRUBIN DIRECT: 1 mg/dL — AB (ref 0.1–0.5)
Indirect Bilirubin: 2.1 mg/dL — ABNORMAL HIGH (ref 0.3–0.9)
TOTAL PROTEIN: 6.9 g/dL (ref 6.5–8.1)
Total Bilirubin: 3.1 mg/dL — ABNORMAL HIGH (ref 0.3–1.2)

## 2014-07-19 LAB — CBC
HCT: 37.2 % — ABNORMAL LOW (ref 39.0–52.0)
HEMATOCRIT: 35.9 % — AB (ref 39.0–52.0)
HEMOGLOBIN: 12 g/dL — AB (ref 13.0–17.0)
Hemoglobin: 11.4 g/dL — ABNORMAL LOW (ref 13.0–17.0)
MCH: 29.9 pg (ref 26.0–34.0)
MCH: 30 pg (ref 26.0–34.0)
MCHC: 31.8 g/dL (ref 30.0–36.0)
MCHC: 32.3 g/dL (ref 30.0–36.0)
MCV: 94.2 fL (ref 78.0–100.0)
MCV: 93 fL (ref 78.0–100.0)
PLATELETS: 103 10*3/uL — AB (ref 150–400)
Platelets: 92 10*3/uL — ABNORMAL LOW (ref 150–400)
RBC: 3.81 MIL/uL — ABNORMAL LOW (ref 4.22–5.81)
RBC: 4 MIL/uL — ABNORMAL LOW (ref 4.22–5.81)
RDW: 15.4 % (ref 11.5–15.5)
RDW: 15.4 % (ref 11.5–15.5)
WBC: 10.1 10*3/uL (ref 4.0–10.5)
WBC: 8.5 10*3/uL (ref 4.0–10.5)

## 2014-07-19 LAB — BASIC METABOLIC PANEL
Anion gap: 16 — ABNORMAL HIGH (ref 5–15)
BUN: 64 mg/dL — ABNORMAL HIGH (ref 6–20)
CALCIUM: 8.4 mg/dL — AB (ref 8.9–10.3)
CO2: 17 mmol/L — ABNORMAL LOW (ref 22–32)
CREATININE: 2.15 mg/dL — AB (ref 0.61–1.24)
Chloride: 108 mmol/L (ref 101–111)
GFR calc non Af Amer: 25 mL/min — ABNORMAL LOW (ref >60)
GFR, EST AFRICAN AMERICAN: 29 mL/min — AB (ref >60)
Glucose, Bld: 166 mg/dL — ABNORMAL HIGH (ref 70–99)
Potassium: 5.1 mmol/L (ref 3.5–5.1)
Sodium: 141 mmol/L (ref 135–145)

## 2014-07-19 LAB — GLUCOSE, CAPILLARY
GLUCOSE-CAPILLARY: 141 mg/dL — AB (ref 70–99)
GLUCOSE-CAPILLARY: 163 mg/dL — AB (ref 70–99)
Glucose-Capillary: 163 mg/dL — ABNORMAL HIGH (ref 70–99)
Glucose-Capillary: 160 mg/dL — ABNORMAL HIGH (ref 70–99)
Glucose-Capillary: 130 mg/dL — ABNORMAL HIGH (ref 70–99)

## 2014-07-19 LAB — AMMONIA: Ammonia: 58 umol/L — ABNORMAL HIGH (ref 9–35)

## 2014-07-19 LAB — TROPONIN I
Troponin I: 0.44 ng/mL — ABNORMAL HIGH (ref <0.031)
Troponin I: 0.53 ng/mL (ref <0.031)
Troponin I: 0.63 ng/mL (ref <0.031)

## 2014-07-19 LAB — TYPE AND SCREEN
ABO/RH(D): O POS
ANTIBODY SCREEN: NEGATIVE

## 2014-07-19 LAB — LACTIC ACID, PLASMA
Lactic Acid, Venous: 16 mmol/L (ref 0.5–2.0)
Lactic Acid, Venous: 11.1 mmol/L (ref 0.5–2.0)
Lactic Acid, Venous: 7.7 mmol/L (ref 0.5–2.0)
Lactic Acid, Venous: 2.7 mmol/L (ref 0.5–2.0)

## 2014-07-19 LAB — PROCALCITONIN: PROCALCITONIN: 2.53 ng/mL

## 2014-07-19 LAB — BLOOD GAS, ARTERIAL
ACID-BASE DEFICIT: 14 mmol/L — AB (ref 0.0–2.0)
ACID-BASE DEFICIT: 7.3 mmol/L — AB (ref 0.0–2.0)
ACID-BASE DEFICIT: 7.5 mmol/L — AB (ref 0.0–2.0)
Bicarbonate: 11.2 mEq/L — ABNORMAL LOW (ref 20.0–24.0)
Bicarbonate: 16.6 mEq/L — ABNORMAL LOW (ref 20.0–24.0)
Bicarbonate: 17.2 mEq/L — ABNORMAL LOW (ref 20.0–24.0)
DRAWN BY: 365271
DRAWN BY: 307971
Drawn by: 41308
FIO2: 0.55 %
O2 CONTENT: 4 L/min
O2 Content: 4 L/min
O2 SAT: 97.2 %
O2 SAT: 98.1 %
O2 Saturation: 97.5 %
PCO2 ART: 22.6 mmHg — AB (ref 35.0–45.0)
PO2 ART: 113 mmHg — AB (ref 80.0–100.0)
PO2 ART: 115 mmHg — AB (ref 80.0–100.0)
PO2 ART: 116 mmHg — AB (ref 80.0–100.0)
Patient temperature: 96.7
Patient temperature: 98.6
Patient temperature: 99.3
TCO2: 12 mmol/L (ref 0–100)
TCO2: 17.5 mmol/L (ref 0–100)
TCO2: 18.2 mmol/L (ref 0–100)
pCO2 arterial: 29.1 mmHg — ABNORMAL LOW (ref 35.0–45.0)
pCO2 arterial: 31.8 mmHg — ABNORMAL LOW (ref 35.0–45.0)
pH, Arterial: 7.309 — ABNORMAL LOW (ref 7.350–7.450)
pH, Arterial: 7.353 (ref 7.350–7.450)
pH, Arterial: 7.377 (ref 7.350–7.450)

## 2014-07-19 LAB — PROTIME-INR
INR: 3.51 — ABNORMAL HIGH (ref 0.00–1.49)
Prothrombin Time: 35.5 seconds — ABNORMAL HIGH (ref 11.6–15.2)

## 2014-07-19 LAB — DIC (DISSEMINATED INTRAVASCULAR COAGULATION)PANEL: Platelets: 97 10*3/uL — ABNORMAL LOW (ref 150–400)

## 2014-07-19 LAB — SAVE SMEAR

## 2014-07-19 LAB — DIC (DISSEMINATED INTRAVASCULAR COAGULATION) PANEL
APTT: 49 s — AB (ref 24–37)
FIBRINOGEN: 89 mg/dL — AB (ref 204–475)
INR: 3.15 — AB (ref 0.00–1.49)
PROTHROMBIN TIME: 32.6 s — AB (ref 11.6–15.2)
Smear Review: NONE SEEN

## 2014-07-19 LAB — LIPASE, BLOOD: Lipase: 42 U/L (ref 22–51)

## 2014-07-19 LAB — OSMOLALITY, URINE: Osmolality, Ur: 490 mOsm/kg (ref 390–1090)

## 2014-07-19 LAB — SODIUM, URINE, RANDOM: SODIUM UR: 17 mmol/L

## 2014-07-19 LAB — CORTISOL: Cortisol, Plasma: >100 ug/dL

## 2014-07-19 LAB — ABO/RH: ABO/RH(D): O POS

## 2014-07-19 LAB — SALICYLATE LEVEL: Salicylate Lvl: <4 mg/dL (ref 2.8–30.0)

## 2014-07-19 LAB — MRSA PCR SCREENING: MRSA BY PCR: NEGATIVE

## 2014-07-19 LAB — APTT: aPTT: 43 seconds — ABNORMAL HIGH (ref 24–37)

## 2014-07-19 LAB — ACETAMINOPHEN LEVEL: Acetaminophen (Tylenol), Serum: <10 ug/mL — ABNORMAL LOW (ref 10–30)

## 2014-07-19 LAB — CREATININE, URINE, RANDOM: CREATININE, URINE: 68.32 mg/dL

## 2014-07-19 LAB — TSH: TSH: 1.648 u[IU]/mL (ref 0.350–4.500)

## 2014-07-19 LAB — OSMOLALITY: Osmolality: 325 mOsm/kg — ABNORMAL HIGH (ref 275–300)

## 2014-07-19 MED ORDER — HALOPERIDOL LACTATE 5 MG/ML IJ SOLN
5.0000 mg | Freq: Once | INTRAMUSCULAR | Status: AC
  Administered 2014-07-19: 5 mg via INTRAVENOUS
  Filled 2014-07-19: qty 1

## 2014-07-19 MED ORDER — ARFORMOTEROL TARTRATE 15 MCG/2ML IN NEBU
15.0000 ug | INHALATION_SOLUTION | Freq: Two times a day (BID) | RESPIRATORY_TRACT | Status: DC
  Administered 2014-07-19 – 2014-08-01 (×23): 15 ug via RESPIRATORY_TRACT
  Filled 2014-07-19 (×31): qty 2

## 2014-07-19 MED ORDER — HALOPERIDOL LACTATE 5 MG/ML IJ SOLN
2.5000 mg | Freq: Once | INTRAMUSCULAR | Status: AC
  Administered 2014-07-19: 2.5 mg via INTRAVENOUS

## 2014-07-19 MED ORDER — PANTOPRAZOLE SODIUM 40 MG IV SOLR
40.0000 mg | INTRAVENOUS | Status: DC
  Administered 2014-07-19 – 2014-08-01 (×14): 40 mg via INTRAVENOUS
  Filled 2014-07-19 (×21): qty 40

## 2014-07-19 MED ORDER — ALBUTEROL SULFATE (2.5 MG/3ML) 0.083% IN NEBU
2.5000 mg | INHALATION_SOLUTION | RESPIRATORY_TRACT | Status: DC | PRN
  Administered 2014-07-19 – 2014-07-25 (×2): 2.5 mg via RESPIRATORY_TRACT
  Filled 2014-07-19 (×2): qty 3

## 2014-07-19 MED ORDER — SODIUM CHLORIDE 0.9 % IV SOLN
Freq: Once | INTRAVENOUS | Status: AC
  Administered 2014-07-19: 10 mL/h via INTRAVENOUS

## 2014-07-19 MED ORDER — HALOPERIDOL LACTATE 5 MG/ML IJ SOLN
INTRAMUSCULAR | Status: AC
  Administered 2014-07-19: 2.5 mg via INTRAVENOUS
  Filled 2014-07-19: qty 1

## 2014-07-19 MED ORDER — SODIUM CHLORIDE 0.9 % IV SOLN
INTRAVENOUS | Status: AC
  Administered 2014-07-19: 75 mL/h via INTRAVENOUS

## 2014-07-19 MED ORDER — ACETAMINOPHEN 650 MG RE SUPP
650.0000 mg | Freq: Four times a day (QID) | RECTAL | Status: DC | PRN
  Administered 2014-07-23: 650 mg via RECTAL
  Filled 2014-07-19: qty 1

## 2014-07-19 MED ORDER — ASPIRIN 300 MG RE SUPP
300.0000 mg | Freq: Every day | RECTAL | Status: DC
  Administered 2014-07-21 – 2014-07-23 (×3): 300 mg via RECTAL
  Filled 2014-07-19 (×5): qty 1

## 2014-07-19 MED ORDER — BUDESONIDE 0.25 MG/2ML IN SUSP: 0.2500 mg | Freq: Two times a day (BID) | RESPIRATORY_TRACT | Status: DC

## 2014-07-19 MED ORDER — MORPHINE SULFATE 2 MG/ML IJ SOLN
2.0000 mg | Freq: Once | INTRAMUSCULAR | Status: AC
  Administered 2014-07-19: 2 mg via INTRAVENOUS
  Filled 2014-07-19: qty 1

## 2014-07-19 MED ORDER — HALOPERIDOL LACTATE 5 MG/ML IJ SOLN
2.5000 mg | Freq: Four times a day (QID) | INTRAMUSCULAR | Status: DC | PRN
  Administered 2014-07-19: 5 mg via INTRAVENOUS
  Filled 2014-07-19: qty 1

## 2014-07-19 MED ORDER — IPRATROPIUM-ALBUTEROL 0.5-2.5 (3) MG/3ML IN SOLN: 3.0000 mL | Freq: Four times a day (QID) | RESPIRATORY_TRACT | Status: DC

## 2014-07-19 MED ORDER — BUDESONIDE 0.25 MG/2ML IN SUSP
0.2500 mg | Freq: Two times a day (BID) | RESPIRATORY_TRACT | Status: DC
  Administered 2014-07-19 – 2014-08-01 (×23): 0.25 mg via RESPIRATORY_TRACT
  Filled 2014-07-19 (×31): qty 2

## 2014-07-19 MED ORDER — DEXMEDETOMIDINE HCL IN NACL 400 MCG/100ML IV SOLN
0.4000 ug/kg/h | INTRAVENOUS | Status: DC
  Administered 2014-07-19: 0.4 ug/kg/h via INTRAVENOUS
  Administered 2014-07-19: 0.7 ug/kg/h via INTRAVENOUS
  Administered 2014-07-20 (×2): 0.4 ug/kg/h via INTRAVENOUS
  Administered 2014-07-20: 0.7 ug/kg/h via INTRAVENOUS
  Administered 2014-07-21 (×2): 0.6 ug/kg/h via INTRAVENOUS
  Administered 2014-07-21: 0.3 ug/kg/h via INTRAVENOUS
  Administered 2014-07-22: 1.2 ug/kg/h via INTRAVENOUS
  Administered 2014-07-22: 0.7 ug/kg/h via INTRAVENOUS
  Administered 2014-07-22 (×3): 1.2 ug/kg/h via INTRAVENOUS
  Administered 2014-07-23: 1 ug/kg/h via INTRAVENOUS
  Administered 2014-07-23 (×2): 1.2 ug/kg/h via INTRAVENOUS
  Administered 2014-07-23: 0.4 ug/kg/h via INTRAVENOUS
  Administered 2014-07-24 (×3): 1 ug/kg/h via INTRAVENOUS
  Administered 2014-07-25: 0.2 ug/kg/h via INTRAVENOUS
  Administered 2014-07-25: 0.7 ug/kg/h via INTRAVENOUS
  Filled 2014-07-19: qty 100
  Filled 2014-07-19: qty 50
  Filled 2014-07-19: qty 100
  Filled 2014-07-19: qty 50
  Filled 2014-07-19 (×3): qty 100
  Filled 2014-07-19 (×2): qty 50
  Filled 2014-07-19 (×2): qty 100
  Filled 2014-07-19 (×3): qty 50
  Filled 2014-07-19: qty 100
  Filled 2014-07-19: qty 50
  Filled 2014-07-19: qty 100
  Filled 2014-07-19: qty 50
  Filled 2014-07-19 (×2): qty 100
  Filled 2014-07-19: qty 50
  Filled 2014-07-19: qty 100
  Filled 2014-07-19: qty 50

## 2014-07-19 MED ORDER — METHYLPREDNISOLONE SODIUM SUCC 40 MG IJ SOLR
40.0000 mg | Freq: Two times a day (BID) | INTRAMUSCULAR | Status: DC
  Administered 2014-07-19 (×2): 40 mg via INTRAVENOUS
  Filled 2014-07-19 (×5): qty 1

## 2014-07-19 MED ORDER — SODIUM CHLORIDE 0.9 % IV BOLUS (SEPSIS)
1000.0000 mL | Freq: Once | INTRAVENOUS | Status: AC
  Administered 2014-07-19: 1000 mL via INTRAVENOUS

## 2014-07-19 MED ORDER — ARFORMOTEROL TARTRATE 15 MCG/2ML IN NEBU: 15.0000 ug | INHALATION_SOLUTION | Freq: Two times a day (BID) | RESPIRATORY_TRACT | Status: DC

## 2014-07-19 MED ORDER — HALOPERIDOL LACTATE 5 MG/ML IJ SOLN: 2.0000 mg | Freq: Four times a day (QID) | INTRAMUSCULAR | Status: AC | PRN

## 2014-07-19 MED ORDER — SODIUM CHLORIDE 0.9 % IV BOLUS (SEPSIS): 1000.0000 mL | Freq: Once | INTRAVENOUS | Status: DC

## 2014-07-19 MED ORDER — IPRATROPIUM-ALBUTEROL 0.5-2.5 (3) MG/3ML IN SOLN
RESPIRATORY_TRACT | Status: AC
  Administered 2014-07-19: 3 mL
  Filled 2014-07-19: qty 3

## 2014-07-19 NOTE — Progress Notes (Signed)
Pt c/o of chest pain. Pt HR 110-120's ST 154/79. EKG done and Np called. Will continue to monitor pt.

## 2014-07-19 NOTE — Progress Notes (Signed)
Addendum  Alerted by nursing: worsening SOB and stool admixed with some blood. On arrival to bed side, his breathing had improved with nebs. Still drowsy and confused.  Labs: c/w Live failure: unclear etiology- ? Unknown toxic exposure. Coagulopathy> family states no Alcohol or Tylenol use. DIC picture. No schistocytes.  GI bleed: likely complicated by d/t coagulopathy, low platelets  Discussed extensively with dtr Ms Darnelle CatalanMalinda and several male family members at bedside. Informed that he is critically ill and overall prognosis is very poor. They discussed amongst themselves and changed Code status- would like intubation/mechanical Vent PRN but no CPR/shocking. Discussed with Glade NurseE Link MD- will send partner to evaluate.  Checking Ammonia, tylenol level and repeat ABG.  Marcellus ScottHONGALGI,ANAND, MD, FACP, FHM. [facility] Pager [phone]  If 7PM-7AM, please contact night-coverage [email or web address] Password [facility] 07/19/2014, 3:45 PM

## 2014-07-19 NOTE — Progress Notes (Signed)
Pt is yelling out and attempting to pull out lines and climbing out of the bed and is having increased confusion. Rn tried to redirect pt this was unsuccessful. Attempted to toilet pt this was unsuccessful. Rapid response at bed side. Np paged and gave orders for 2.5 of Haldol. Will continue to monitor pt. After haldol was given Pt urinated in the urinal.

## 2014-07-19 NOTE — Progress Notes (Signed)
  Echocardiogram 2D Echocardiogram has been performed.  Danny [name], Danny [name] 07/19/2014, 1:54 PM

## 2014-07-19 NOTE — Progress Notes (Addendum)
eLink Physician-Brief Progress Note Patient Name: Danny [name] DOB: Dec 12, 1920 MRN: [id number]   Date of Service  07/19/2014  HPI/Events of Note  Clinical picture c/w severe liver dysfunction: 1. Hypoglycemia, 2. Coagulopatthy - INR = 3.15, 3. Metabolic Acidosis and 4. T. Bilirubin = 3.1 and AST/ALT elevated. The question is what is the etiology of his severe liver dysfunction. Ischemia? Tylenol OD? I suspect that he will continue to deteriorate. Family wants the patient intubated.   eICU Interventions  I have ask Dr. Waymon AmatoHongalgi to send: 1. ABG now. 2. Tylenol Level now. 3. NH3 level now.   Dr. Craige CottaSood requested to re-evaluate the patient for intubation.      Intervention Category Major Interventions: Acid-Base disturbance - evaluation and management  [name],Danny Eugene 07/19/2014, 3:50 PM

## 2014-07-19 NOTE — Progress Notes (Signed)
Called by primary RN to assist with pt.  He is very agitated and confused.  HR 130s BP 150s & getting a fluid bolus.  Suggested she contact primary team and ask for haldol.  Haldol 2.5mg  IV given with good success.  Pt relaxed and sleeping

## 2014-07-19 NOTE — Progress Notes (Signed)
Pt in yelling having increased confusion. Pt thrashing in bed pulling at IV lines and Monitor cables. Unable to calm pt down. Attempt in toileting on bed and using urinal with out success. Pt HR sustainted 130's. Pt will not follow commands. PT does respond to name but unoritated to time, place and situation. Rapid response notified of change in pt. NP paged and gave orders to give 0.5mg  of ativan. Pt relaxed and stopped yelling out. Pt still very restless in the bed. Mitts placed on pt to prevent pulling out IV's. Will continue to monitor pt.

## 2014-07-19 NOTE — Consult Note (Signed)
PULMONARY / CRITICAL CARE MEDICINE   Name: Danny SisJesse L Flicker MRN: [id number] DOB: 12/29/1920    ADMISSION DATE:  07/25/2014 CONSULTATION DATE:  07/19/2014  REFERRING MD :  Waymon AmatoHongalgi  CHIEF COMPLAINT:  Confusion  INITIAL PRESENTATION:  81 yo male smoker with weakness, hypothermia, hypoglycemia, AKI, metabolic acidosis with respiratory alkalosis, and possible sepsis.  STUDIES:  5/07 CT head >> chronic cerebellum and Lt thalamus infarct 5/07 Echo >> 5/07 Renal u/s >>  SIGNIFICANT EVENTS: 5/07 Admit, neuro consulted   HISTORY OF PRESENT ILLNESS:   81 yo male was feeling weak.  His family noted he had slurred speech and Rt facial droop.  He was seen in ER with concern for CVA.  He was noted to be hypothermic and blood sugar was 17.  He also had AKI, metabolic acidosis with elevated lactic acid, and hypotension.  He was seen by stroke service.  He was started on Abx, and given dextrose.  He was started on warming blanket.  He was admitted to SDU.  He developed agitation and received several doses of ativan, morphine, and haldol.  PAST MEDICAL HISTORY :   has a past medical history of DIABETES MELLITUS, TYPE II (12/06/2006); NEPHROPATHY, DIABETIC (12/06/2006); HYPERLIPIDEMIA (12/06/2006); HYPOKALEMIA (01/12/2009); TOBACCO USER (05/13/2009); HYPERTENSION (12/06/2006); CORONARY ATHEROSCLEROSIS, NATIVE VESSEL (05/13/2009); Atrial fibrillation (05/04/2009); COPD (12/06/2006); GERD (12/06/2006); DIVERTICULOSIS, COLON (12/06/2006); CONSTIPATION (12/20/2007); RECTAL BLEEDING (12/20/2007); RENAL INSUFFICIENCY (12/20/2007); BACK PAIN, LUMBAR (01/21/2008); DIZZINESS OR VERTIGO (12/06/2006); ABNORMAL EKG (05/13/2009); Diastolic dysfunction; and Blindness of right eye.  has past surgical history that includes Eye surgery (Bilateral). Prior to Admission medications   Medication Sig Start Date End Date Taking? Authorizing Provider  albuterol (PROVENTIL HFA;VENTOLIN HFA) 108 (90 BASE) MCG/ACT inhaler Inhale 1-2 puffs into the  lungs every 6 (six) hours as needed for wheezing or shortness of breath. 02/20/14  Yes Marisa Severinlga Otter, MD  aspirin 81 MG tablet Take 81 mg by mouth every evening.    Yes Historical Provider, MD  dextromethorphan-guaiFENesin (MUCINEX DM) 30-600 MG per 12 hr tablet Take 1 tablet by mouth 2 (two) times daily as needed for cough.   Yes Historical Provider, MD  Fluticasone Furoate-Vilanterol (BREO ELLIPTA) 100-25 MCG/INH AEPB Inhale 1 puff into the lungs every morning. 07/15/14  Yes Nyoka CowdenMichael B Wert, MD  furosemide (LASIX) 20 MG tablet Take 20 mg by mouth daily.   Yes Historical Provider, MD  zolpidem (AMBIEN) 5 MG tablet Take 1 tablet (5 mg total) by mouth at bedtime as needed for sleep. Patient not taking: Reported on 07/28/2014 03/31/14   Romero BellingSean Ellison, MD   No Known Allergies  FAMILY HISTORY:  indicated that his mother is deceased. He indicated that his father is deceased.  SOCIAL HISTORY:  reports that he has been smoking Cigarettes.  He has a 73 pack-year smoking history. He has never used smokeless tobacco. He reports that he does not drink alcohol or use illicit drugs.  REVIEW OF SYSTEMS:   Unable to obtain  SUBJECTIVE:   VITAL SIGNS: Temp:  [94.8 F (34.9 C)-99.3 F (37.4 C)] 99.3 F (37.4 C) (05/08 0512) Pulse Rate:  [25-136] 39 (05/08 0800) Resp:  [10-39] 24 (05/08 0800) BP: (125-175)/(73-104) 144/74 mmHg (05/08 0827) SpO2:  [87 %-100 %] 96 % (05/08 0800) Weight:  [132 lb 15 oz (60.3 kg)-137 lb 2 oz (62.2 kg)] 137 lb 2 oz (62.2 kg) (05/08 0335) INTAKE / OUTPUT:  Intake/Output Summary (Last 24 hours) at 07/19/14 0842 Last data filed at 07/19/14 [id number]  Gross per 24  hour  Intake   2825 ml  Output    175 ml  Net   2650 ml    PHYSICAL EXAMINATION: General: ill appearing, cachetic Neuro:  Obtunded, mumbles and moves extremities with stimulation HEENT: dry oral mucosa Cardiovascular:  Irregular, tachycardic Lungs:  Decreased breath sounds, basilar crackles Abdomen:  Soft, mild  epigastric tenderness, no rebound/guarding Musculoskeletal:  Decreased muscle bulk Skin:  No rashes  LABS:  CBC  Recent Labs Lab 25-Jul-2014 1639 2014-07-25 1645 2014-07-25 2301 07/19/14 0430  WBC 6.9  --  11.1* 10.1  HGB 12.8* 15.0 11.6* 11.4*  HCT 39.6 44.0 36.7* 35.9*  PLT 166  --  120* 103*   Coag's  Recent Labs Lab 07/25/14 1639 2014/07/25 2301  APTT 32 43*  INR 2.85* 3.51*   BMET  Recent Labs Lab 07/25/14 1639 25-Jul-2014 1645 07/19/14 0430  NA 138 140 141  K 4.8 4.7 5.1  CL 100* 106 108  CO2 <5*  --  17*  BUN 63* 56* 64*  CREATININE 2.61* 2.10* 2.15*  GLUCOSE <20* <20* 166*   Electrolytes  Recent Labs Lab 07-25-14 1639 07/19/14 0430  CALCIUM 9.7 8.4*   Sepsis Markers  Recent Labs Lab 2014-07-25 1854 07/25/2014 2220 07-25-2014 2301 Jul 25, 2014 2318 07/19/14 0200  LATICACIDVEN 16.0*  --   --  11.1* 7.7*  PROCALCITON  --  2.30 2.53  --   --    ABG  Recent Labs Lab 2014-07-25 1851 07/19/14 0001  PHART 7.167* 7.309*  PCO2ART 19.7* 22.6*  PO2ART 114.0* 116*   Liver Enzymes  Recent Labs Lab Jul 25, 2014 1639  AST 55*  ALT <5*  ALKPHOS 75  BILITOT 2.9*  ALBUMIN 3.5   Cardiac Enzymes  Recent Labs Lab 07-25-2014 2000 25-Jul-2014 2301 07/19/14 0430  TROPONINI 0.51* 0.44* 0.53*   Glucose  Recent Labs Lab 07/25/2014 1640 2014-07-25 1654 2014-07-25 1701 Jul 25, 2014 1840 2014/07/25 2129 25-Jul-2014 2359  GLUCAP 17* 189* 186* 185* 163* 141*    Imaging Ct Head Wo Contrast  Jul 25, 2014   CLINICAL DATA:  Code stroke. Right-sided facial droop in unresponsiveness.  EXAM: CT HEAD WITHOUT CONTRAST  TECHNIQUE: Contiguous axial images were obtained from the base of the skull through the vertex without intravenous contrast.  COMPARISON:  None.  FINDINGS: Asymmetric CSF space over the left parietal lobe near the vertex measures approximately 3 cm and is somewhat ovoid in shape, possibly an incidental arachnoid cyst. There is moderate cerebral atrophy. There is no evidence of  acute cortical infarct, intracranial hemorrhage, mass, midline shift, or extra-axial fluid collection. Patchy hypodensities in the subcortical and deep cerebral white matter bilaterally are nonspecific but compatible with mild chronic small vessel ischemic disease. Small, chronic infarcts are noted in the cerebellum bilaterally. There is also a punctate, 3 mm focus of hypoattenuation in the left thalamus compatible with a lacunar infarct.  Prior left cataract extraction and a right ocular prosthesis are noted. Cerumen is noted in the left EAC. Mastoid air cells are clear. Mild paranasal sinus mucosal thickening is noted bilaterally. There is moderate calcified atherosclerosis at the skull base.  IMPRESSION: 1. No evidence of acute intracranial hemorrhage or acute large territory infarct. 2. Small infarcts in the cerebellum which appear chronic. Punctate lacunar infarct in the left thalamus of indeterminate age. 3. Mild chronic small vessel ischemic disease and moderate cerebral atrophy.  These results were called by telephone at the time of interpretation on Jul 25, 2014 at 5:30 pm to Dr. Thad Ranger, who verbally acknowledged these results.   Electronically  Signed   By: Sebastian AcheAllen  Grady   On: 07/30/2014 17:31   Dg Chest Port 1 View  07/17/2014   CLINICAL DATA:  Elevated troponin.  EXAM: PORTABLE CHEST - 1 VIEW  COMPARISON:  02/20/2014.  FINDINGS: Stable enlarged cardiac silhouette, hyperexpanded lungs and diffusely prominent interstitial markings. Bilateral shoulder degenerative changes with superior migration of both humeral heads and bony remodeling on the right.  IMPRESSION: 1. Stable cardiomegaly and changes of COPD. 2. Bilateral shoulder degenerative changes and chronic rotator cuff tears.   Electronically Signed   By: Beckie SaltsSteven  Reid M.D.   On: 08/04/2014 19:07     ASSESSMENT / PLAN:  PULMONARY A: Acute respiratory alkalosis. AECOPD. P:   F/u CXR, ABG Change to brovana, pulmicort with prn albuterol Oxygen to  keep SpO2 > 92% Solumedrol  CARDIOVASCULAR A:  Sepsis. Hx of CAD, chronic systolic CHF (EF 25 to 30% from 05/11/13). PAF. P:  F/u Echo Add IV fluids  RENAL A:   AKI >> baseline creatinine 1.29 from 02/25/14. Anion gap metabolic acidosis. Lactic acidosis. Osmolar gap >> measured osmolarity 325, calculated 300. P:   F/u ABG, BMET Check salicylate level, FeNa Check ethylene glycol, methanol F/u lactic acid level F/u renal u/s Might need nephrology assessment  GASTROINTESTINAL A:   Abdominal pain. P:   NPO Check lipase  HEMATOLOGIC A:   Mild thrombocytopenia. Coagulopathy. P:  F/u CBC, INR SCD's  INFECTIOUS A:   Concern for sepsis ?source. P:   Day 2 vancomycin, zosyn  Blood 5/07 >>   ENDOCRINE A:   Hypoglycemia. P:   Dextrose in IV fluid Check TSH, cortisol  NEUROLOGIC A:   Acute encephalopathy with concern for CVA. P:   Hold ASA for now due to concern with metabolic acidosis and respiratory alkalosis F/u with neuro Try to minimize sedatives, narcotics  D/w Dr. Waymon AmatoHongalgi  CC time 45 minutes.  Coralyn HellingVineet Negar Sieler, MD [facility] 07/19/2014, 8:58 AM Pager:  [phone] After 3pm call: [phone]

## 2014-07-19 NOTE — Progress Notes (Signed)
Pt to TX to 2S-10, called report. Family present and aware of TX.

## 2014-07-19 NOTE — Progress Notes (Signed)
eLink Physician-Brief Progress Note Patient Name: Danny [name] DOB: 29-Jun-1920 MRN: [id number]   Date of Service  07/19/2014  HPI/Events of Note  Maroon, liquid stool. INR = 3.1, Platelets = 92K and Fibrinogen < 100. Already on Q 12 hour CBC's.  eICU Interventions  Will order: 1. Transfuse 4 units FFP when available. 2. Check INR and Fibrinogen s/p transfusion. 3. Hemoccult stool. 4. Protonix 40 mg IV Q day.      Intervention Category Intermediate Interventions: Coagulopathy - evaluation and management  Sommer,Steven Eugene 07/19/2014, 7:34 PM

## 2014-07-19 NOTE — Progress Notes (Signed)
eLink Physician-Brief Progress Note Patient Name: Danny [name] DOB: 16-Jul-1920 MRN: [id number]   Date of Service  07/19/2014  HPI/Events of Note  Bedside nurse requests bilateral wrist restraints.   eICU Interventions  Will order bilateral wrist restraints.      Intervention Category Major Interventions: Delirium, psychosis, severe agitation - evaluation and management  [name] 07/19/2014, 6:06 PM

## 2014-07-19 NOTE — Progress Notes (Signed)
Pt is combative attempting to hit kick and bit staff. Pt is yelling and confused is not following commands trieing to pull out lines and climb out the bed. Np notified and gave orders to give 5mg  of haldol and start restraints. Will continue to monitor pt.

## 2014-07-19 NOTE — Progress Notes (Signed)
Pt arrived to the unit. Placed on monitor and transferred to the bed. HR 130's Pt is AAX2, MD notified of of pt status and Critical labs. MD gave orders and orders initiated and rapid response notified of pt's status. Will continue to monitor. Family updated and at bed side.

## 2014-07-19 NOTE — Progress Notes (Signed)
Pt becoming agitated again, cleaned stool, found blood-tinged, placed Pt on venti mask, paged RT for breathing TX & assessment. Paged attending re new findings. Dr requested a contact to PCCM/e link. Attending now here. Will continue to monitor and await orders.

## 2014-07-19 NOTE — Progress Notes (Addendum)
PROGRESS NOTE    Danny [name] ZDG:[id number] DOB: 07-14-1920 DOA: 07/21/2014 PCP: Romero BellingELLISON, SEAN, MD  HPI/Brief narrative 81 year old African-American male patient, lives with spouse, highly functional mentally and physically per family, PMH of chronic systolic CHF, CAD, COPD, ongoing tobacco abuse, DM 2 with renal complications, HLD, HTN and? A. fib has been unwell for several days, dry cough, decreased appetite and [facility] on 5/7 with less responsiveness, slurred speech and facial droop. Stroke code was called in ED. In the ED found to have glucose of 17 (not on diabetic meds at home), hypothermic at 17F, tachypneic and tachycardic. He was admitted to stepdown unit for acute metabolic encephalopathy and possible sepsis.   Assessment/Plan:  Acute metabolic encephalopathy/agitation - Multifactorial secondary to hypoglycemia, hypothermia, metabolic acidosis, acute on chronic kidney disease, possible sepsis complicating underlying possible mild dementia. - CT head negative - Neurology consultation and follow-up appreciated: Low index of suspicion for CVA or CNS infection. Suspect presentation all secondary to metabolic abnormalities. - Monitor closely. - Continue rectal aspirin until able to take by mouth. - Minimize sedating medications as much as possible - UDS: Negative - Blood alcohol level on admission: Less than 5  Hypoglycemia - Unclear etiology. Not on oral hypoglycemics at home. - ? Secondary to worsening renal insufficiency.? Liver disease: LFTs rising anticoagulopathy. - Treated with dextrose infusions. - Seems to have resolved. Monitor closely. - TSH normal - Follow cortisol levels - Check abdominal ultrasound.  Hypothermia - Unclear etiology. May be secondary to hypoglycemia or sepsis. - TSH: 1.648 - Check cortisol - Resolved  Acute exacerbation of COPD/acute respiratory failure with hypoxia - Chest x-ray negative for pneumonia - ABG shows  improvement of pH, PCO2 and bicarbonate - Oxygen supplements, continue IV antibiotics, add low-dose IV Solu-Medrol and bronchodilator nebulization's  Possible sepsis - Source unclear. - Urine microscopy and chest x-ray negative for etiology - Brief and gentle IV fluids, IV vancomycin and Zosyn - Follow outstanding culture results.  Acute on stage III chronic kidney disease - Precipitated by dehydration, possible sepsis and diuretics. - Improving. - Continue gentle IV fluid hydration while monitoring closely for volume overload (history of CHF) - Renal ultrasound: No hydronephrosis  Anion gap metabolic acidosis/lactic acidosis/acute respiratory alkalosis - Unclear etiology - Salicylate level normal - Check ethylene glycol and methanol level - Brief gentle IV fluid hydration and follow BMP - Improving  Anemia - Stable - Follow CBCs  Thrombocytopenia - May be related to sepsis - Follow CBCs - Check peripheral smear.  Elevated troponin - Patient apparently reported chest pain this morning but none at this time - EKG without acute changes - Flat trend - Elevated troponin may be secondary to demand ischemia from acute illness - Continue aspirin when able - Follow 2-D echo for wall motion abnormalities and LVEF  Chronic systolic CHF/history of CAD/ ??PAF - Clinically dehydrated - Monitor closely while on IV fluids - Follow 2-D echo  Left kidney complex cyst on renal ultrasound - Suspicious for malignancy - MRI-as outpatient when able  Abnormal LFTs/coagulopathy - Seems to have worsened compared to admission. - Unclear etiology - We will obtain abdominal ultrasound and follow LFTs. - Check acetaminophen levels. - Check DIC panel  Code Status: Full code, confirmed with patient's daughter Ms. Malinda on 07/19/14 Family Communication: Discussed in detail with patient's daughter Ms. Malinda on 07/19/14. Updated care including critical nature of patient's multiple medical  problems. Updated care and answered questions. Disposition Plan: Continue treatment in stepdown  unit.   Consultants:  Neurology  CCM  Procedures:  None  Antibiotics:  IV Zosyn 5/7 >  IV vancomycin 5/7 >   Subjective: Overnight events noted. Discussed with nursing. Got very agitated overnight and received a couple doses of Haldol. Dyspnea improved. Reported chest pain early this morning.  Objective: Filed Vitals:   07/19/14 0700 07/19/14 0800 07/19/14 0827 07/19/14 0902  BP: 152/84  144/74   Pulse: 25 39  102  Temp:      TempSrc:      Resp: 18 24  24   Height:      Weight:      SpO2: 97% 96%  97%   temperature: 99.69F. Blood pressure 144/74 mmHg.  Intake/Output Summary (Last 24 hours) at 07/19/14 1250 Last data filed at 07/19/14 [id number]  Gross per 24 hour  Intake   2825 ml  Output    175 ml  Net   2650 ml   Filed Weights   2014-05-29 2200 07/19/14 0335  Weight: 60.3 kg (132 lb 15 oz) 62.2 kg (137 lb 2 oz)     Exam:  General exam: Moderately built and thinly nourished ill-looking elderly male lying comfortably propped up in bed. Posey belt +. Bilateral mittens. Mucosa dry Respiratory system: Reduced breath sounds bilaterally with scattered medium pitched expiratory rhonchi but no crackles. No increased work of breathing. Cardiovascular system: S1 & S2 heard, regular tachycardic. No JVD, murmurs, gallops, clicks or pedal edema. Telemetry: Sinus tachycardia in the 120s with frequent PACs.?? PAF Gastrointestinal system: Abdomen is nondistended, soft and nontender. Normal bowel sounds heard. Bladder scan: 152 ML Central nervous system: Sedated. Arousable-will briefly open eyes and moves all limbs spontaneously. No focal deficits. Mumbles incomprehensively. Extremities: Symmetric 5 x 5 power.   Data Reviewed: Basic Metabolic Panel:  Recent Labs Lab 2014-05-29 1639 2014-05-29 1645 07/19/14 0430  NA 138 140 141  K 4.8 4.7 5.1  CL 100* 106 108  CO2 <5*  --  17*    GLUCOSE <20* <20* 166*  BUN 63* 56* 64*  CREATININE 2.61* 2.10* 2.15*  CALCIUM 9.7  --  8.4*   Liver Function Tests:  Recent Labs Lab 2014-05-29 1639 07/19/14 1030  AST 55* 388*  ALT <5* 110*  ALKPHOS 75 63  BILITOT 2.9* 3.1*  PROT 8.3* 6.9  ALBUMIN 3.5 2.8*    Recent Labs Lab 07/19/14 1030  LIPASE 42   No results for input(s): AMMONIA in the last 168 hours. CBC:  Recent Labs Lab 2014-05-29 1639 2014-05-29 1645 2014-05-29 2301 07/19/14 0430  WBC 6.9  --  11.1* 10.1  NEUTROABS 5.8  --  9.0*  --   HGB 12.8* 15.0 11.6* 11.4*  HCT 39.6 44.0 36.7* 35.9*  MCV 95.4  --  96.8 94.2  PLT 166  --  120* 103*   Cardiac Enzymes:  Recent Labs Lab 2014-05-29 2000 2014-05-29 2301 07/19/14 0430 07/19/14 1030  TROPONINI 0.51* 0.44* 0.53* 0.63*   BNP (last 3 results) No results for input(s): PROBNP in the last 8760 hours. CBG:  Recent Labs Lab 2014-05-29 1840 2014-05-29 2129 2014-05-29 2359 07/19/14 0331 07/19/14 0546  GLUCAP 185* 163* 141* 163* 160*    Recent Results (from the past 240 hour(s))  Culture, blood (routine x 2)     Status: None (Preliminary result)   Collection Time: 2014-05-29  5:40 PM  Result Value Ref Range Status   Specimen Description BLOOD LEFT WRIST  Final   Special Requests BOTTLES DRAWN AEROBIC AND ANAEROBIC 4CC  Final   Culture   Final           BLOOD CULTURE RECEIVED NO GROWTH TO DATE CULTURE WILL BE HELD FOR 5 DAYS BEFORE ISSUING A FINAL NEGATIVE REPORT Performed at [facility]    Report Status PENDING  Incomplete  Culture, blood (routine x 2)     Status: None (Preliminary result)   Collection Time: 07/16/2014  5:40 PM  Result Value Ref Range Status   Specimen Description BLOOD RIGHT WRIST  Final   Special Requests BOTTLES DRAWN AEROBIC AND ANAEROBIC 4CC  Final   Culture   Final           BLOOD CULTURE RECEIVED NO GROWTH TO DATE CULTURE WILL BE HELD FOR 5 DAYS BEFORE ISSUING A FINAL NEGATIVE REPORT Performed at [facility]     Report Status PENDING  Incomplete  MRSA PCR Screening     Status: None   Collection Time: 07/19/14  7:00 AM  Result Value Ref Range Status   MRSA by PCR NEGATIVE NEGATIVE Final    Comment:        The GeneXpert MRSA Assay (FDA approved for NASAL specimens only), is one component of a comprehensive MRSA colonization surveillance program. It is not intended to diagnose MRSA infection nor to guide or monitor treatment for MRSA infections.            Studies: Ct Head Wo Contrast  08/01/2014   CLINICAL DATA:  Code stroke. Right-sided facial droop in unresponsiveness.  EXAM: CT HEAD WITHOUT CONTRAST  TECHNIQUE: Contiguous axial images were obtained from the base of the skull through the vertex without intravenous contrast.  COMPARISON:  None.  FINDINGS: Asymmetric CSF space over the left parietal lobe near the vertex measures approximately 3 cm and is somewhat ovoid in shape, possibly an incidental arachnoid cyst. There is moderate cerebral atrophy. There is no evidence of acute cortical infarct, intracranial hemorrhage, mass, midline shift, or extra-axial fluid collection. Patchy hypodensities in the subcortical and deep cerebral white matter bilaterally are nonspecific but compatible with mild chronic small vessel ischemic disease. Small, chronic infarcts are noted in the cerebellum bilaterally. There is also a punctate, 3 mm focus of hypoattenuation in the left thalamus compatible with a lacunar infarct.  Prior left cataract extraction and a right ocular prosthesis are noted. Cerumen is noted in the left EAC. Mastoid air cells are clear. Mild paranasal sinus mucosal thickening is noted bilaterally. There is moderate calcified atherosclerosis at the skull base.  IMPRESSION: 1. No evidence of acute intracranial hemorrhage or acute large territory infarct. 2. Small infarcts in the cerebellum which appear chronic. Punctate lacunar infarct in the left thalamus of indeterminate age. 3. Mild chronic  small vessel ischemic disease and moderate cerebral atrophy.  These results were called by telephone at the time of interpretation on 08/06/2014 at 5:30 pm to Dr. Thad Ranger, who verbally acknowledged these results.   Electronically Signed   By: Sebastian Ache   On: 08/03/2014 17:31   US Renal  07/19/2014   CLINICAL DATA:  Acute kidney injury.  Diabetes.  Hypertension.  EXAM: RENAL / URINARY TRACT ULTRASOUND COMPLETE  COMPARISON:  None.  FINDINGS: Right Kidney:  Length: 9.4 cm. Echogenicity within normal limits. No mass or hydronephrosis visualized. Possible 3 mm calculus in the upper pole. 1.3 cm lower pole cyst.  Left Kidney:  Length: 10.1 cm. Echogenicity within normal limits. No mass or hydronephrosis visualized. 6.4 cm exophytic lower pole cyst. This contains an eccentric  soft tissue component.  Bladder:  Appears normal for degree of bladder distention.  IMPRESSION: 1. No hydronephrosis. 2. 6.4 cm complex lower pole left renal cyst with a soft tissue component. This is concerning for the possibility of a neoplasm. This could be further evaluated with MRI of the kidneys, preferably without and with contrast on an outpatient basis if the patient's renal function returns to an acceptable level. 3. 3 mm probable upper pole right renal calculus.   Electronically Signed   By: Beckie Salts M.D.   On: 07/19/2014 10:14   Dg Chest Port 1 View  07/19/2014   CLINICAL DATA:  Short of breath.  Follow-up exam.  EXAM: PORTABLE CHEST - 1 VIEW  COMPARISON:  07/22/2014.  FINDINGS: Stable enlargement of the cardiopericardial silhouette. No mediastinal or hilar masses. Lungs are hyperexpanded with thickened interstitial markings bilaterally, stable. No lung consolidation or convincing edema. No pleural effusion or pneumothorax.  IMPRESSION: 1. No convincing acute cardiopulmonary disease. 2. Cardiomegaly. Lung hyperexpansion interstitial thickening, chronic, consistent with COPD.   Electronically Signed   By: Amie Portland M.D.   On:  07/19/2014 08:17   Dg Chest Port 1 View  07/25/2014   CLINICAL DATA:  Elevated troponin.  EXAM: PORTABLE CHEST - 1 VIEW  COMPARISON:  02/20/2014.  FINDINGS: Stable enlarged cardiac silhouette, hyperexpanded lungs and diffusely prominent interstitial markings. Bilateral shoulder degenerative changes with superior migration of both humeral heads and bony remodeling on the right.  IMPRESSION: 1. Stable cardiomegaly and changes of COPD. 2. Bilateral shoulder degenerative changes and chronic rotator cuff tears.   Electronically Signed   By: Beckie Salts M.D.   On: 02-Aug-2014 19:07        Scheduled Meds: . arformoterol  15 mcg Nebulization BID  . aspirin  300 mg Rectal Daily  . budesonide (PULMICORT) nebulizer solution  0.25 mg Nebulization BID  . methylPREDNISolone (SOLU-MEDROL) injection  40 mg Intravenous Q12H  . metoprolol  5 mg Intravenous Once  . piperacillin-tazobactam (ZOSYN)  IV  2.25 g Intravenous 4 times per day  . [START ON 07/20/2014] vancomycin  1,000 mg Intravenous Q48H   Continuous Infusions: . sodium chloride      Principal Problem:   Metabolic encephalopathy Active Problems:   CORONARY ATHEROSCLEROSIS, NATIVE VESSEL   Atrial fibrillation   Elevated troponin   Systolic CHF, chronic   Memory loss   Hypoglycemia   Hypothermia   AKI (acute kidney injury)    Time spent: 60 minutes    HONGALGI,ANAND, MD, FACP, FHM. [facility] Pager [phone]  If 7PM-7AM, please contact night-coverage [email or web address] Password [email or web address] 07/19/2014, 12:50 PM    LOS: 1 day

## 2014-07-19 NOTE — Progress Notes (Signed)
Called to assess pt due to concern about mental status and possible need for intubation.  His has been progressively more agitated through the day.  He is actual more alert than he was when I evaluated him this AM.  His blood pressure and oxygenation have been stable/improved.  His heart rhythm remains irregular with rate 90 to 120.  He is moving all extremities and trying to get out of bed.  His lactic acid has improved, and pH improved.  Updated pt's family at bedside.  Will move pt to ICU for closer monitoring and to start on precedex to help control his agitation.  I do not think he needs intubation at this time, but will monitor closely in ICU.  Additional CC time 40 minutes.  Coralyn HellingVineet Ezekeil Bethel, MD [facility] 07/19/2014, 4:38 PM Pager:  [phone] After 3pm call: [phone]

## 2014-07-19 NOTE — Progress Notes (Signed)
CRITICAL VALUE ALERT  Critical value received: LA 16.0  Date of notification:  07/27/2014  Time of notification: Notified by ED RN when pt arrived to the unit at 2148  Critical value read back:No. Unsure  Nurse who received alert:  ED RN related Critical value to Cindra EvesHeather Gracielynn Birkel RN   MD notified (1st page):  Dr. Onalee Huaavid  Time of first page:  2220 laps in time in mis-commication on which RN would page MD  MD notified (2nd page):  Time of second page:  Responding MD: Dr. Onalee Huaavid  Time MD responded: 2223

## 2014-07-19 NOTE — Progress Notes (Signed)
Subjective: Patient sleeping and sedated.  Was combative overnight.  Speech fluent at that time and voice strong.  No focal weakness noted.    Objective: Current vital signs: BP 144/74 mmHg  Pulse 102  Temp(Src) 99.3 F (37.4 C) (Rectal)  Resp 24  Ht 5\' 8"  (1.727 m)  Wt 62.2 kg (137 lb 2 oz)  BMI 20.85 kg/m2  SpO2 97% Vital signs in last 24 hours: Temp:  [94.8 F (34.9 C)-99.3 F (37.4 C)] 99.3 F (37.4 C) (05/08 0512) Pulse Rate:  [25-136] 102 (05/08 0902) Resp:  [10-39] 24 (05/08 0902) BP: (125-175)/(73-104) 144/74 mmHg (05/08 0827) SpO2:  [87 %-100 %] 97 % (05/08 0902) Weight:  [60.3 kg (132 lb 15 oz)-62.2 kg (137 lb 2 oz)] 62.2 kg (137 lb 2 oz) (05/08 0335)  Intake/Output from previous day: 05/07 0701 - 05/08 0700 In: 2825 [I.V.:1000; IV Piggyback:1825] Out: 175 [Urine:175] Intake/Output this shift:   Nutritional status: Diet NPO time specified Except for: Sips with Meds  Neurologic Exam: Mental Status: Lethargic.  Squeezes hands to command.  No speech.   Cranial Nerves: II: Discs flat bilaterally; Pupils equal, round, reactive to light and accommodation III,IV, VI: ptosis not present, intact oculocephalic response with intact eye V,VII: corneals intact bilaterally Motor: Moves all extremities with stimulation Deep Tendon Reflexes: 2+ in the upper extremities and absent in the lower extremities   Lab Results: Basic Metabolic Panel:  Recent Labs Lab 03-25-14 1639 03-25-14 1645 07/19/14 0430  NA 138 140 141  K 4.8 4.7 5.1  CL 100* 106 108  CO2 <5*  --  17*  GLUCOSE <20* <20* 166*  BUN 63* 56* 64*  CREATININE 2.61* 2.10* 2.15*  CALCIUM 9.7  --  8.4*    Liver Function Tests:  Recent Labs Lab 03-25-14 1639  AST 55*  ALT <5*  ALKPHOS 75  BILITOT 2.9*  PROT 8.3*  ALBUMIN 3.5   No results for input(s): LIPASE, AMYLASE in the last 168 hours. No results for input(s): AMMONIA in the last 168 hours.  CBC:  Recent Labs Lab 03-25-14 1639  03-25-14 1645 03-25-14 2301 07/19/14 0430  WBC 6.9  --  11.1* 10.1  NEUTROABS 5.8  --  9.0*  --   HGB 12.8* 15.0 11.6* 11.4*  HCT 39.6 44.0 36.7* 35.9*  MCV 95.4  --  96.8 94.2  PLT 166  --  120* 103*    Cardiac Enzymes:  Recent Labs Lab 03-25-14 2000 03-25-14 2301 07/19/14 0430  TROPONINI 0.51* 0.44* 0.53*    Lipid Panel: No results for input(s): CHOL, TRIG, HDL, CHOLHDL, VLDL, LDLCALC in the last 168 hours.  CBG:  Recent Labs Lab 03-25-14 1840 03-25-14 2129 03-25-14 2359 07/19/14 0331 07/19/14 0546  GLUCAP 185* 163* 141* 163* 160*    Microbiology: Results for orders placed or performed during the hospital encounter of 03-25-14  Culture, blood (routine x 2)     Status: None (Preliminary result)   Collection Time: 03-25-14  5:40 PM  Result Value Ref Range Status   Specimen Description BLOOD LEFT WRIST  Final   Special Requests BOTTLES DRAWN AEROBIC AND ANAEROBIC 4CC  Final   Culture   Final           BLOOD CULTURE RECEIVED NO GROWTH TO DATE CULTURE WILL BE HELD FOR 5 DAYS BEFORE ISSUING A FINAL NEGATIVE REPORT [facility]formed [facility]    Report Status PENDING  Incomplete  Culture, blood (routine x 2)     Status: None (  Preliminary result)   Collection Time: 06-07-14  5:40 PM  Result Value Ref Range Status   Specimen Description BLOOD RIGHT WRIST  Final   Special Requests BOTTLES DRAWN AEROBIC AND ANAEROBIC 4CC  Final   Culture   Final           BLOOD CULTURE RECEIVED NO GROWTH TO DATE CULTURE WILL BE HELD FOR 5 DAYS BEFORE ISSUING A FINAL NEGATIVE REPORT Performed at [facility]    Report Status PENDING  Incomplete    Coagulation Studies:  Recent Labs  06-07-14 1639 06-07-14 2301  LABPROT 30.1* 35.5*  INR 2.85* 3.51*    Imaging: Ct Head Wo Contrast  07/23/2014   CLINICAL DATA:  Code stroke. Right-sided facial droop in unresponsiveness.  EXAM: CT HEAD WITHOUT CONTRAST  TECHNIQUE: Contiguous axial images were obtained from the  base of the skull through the vertex without intravenous contrast.  COMPARISON:  None.  FINDINGS: Asymmetric CSF space over the left parietal lobe near the vertex measures approximately 3 cm and is somewhat ovoid in shape, possibly an incidental arachnoid cyst. There is moderate cerebral atrophy. There is no evidence of acute cortical infarct, intracranial hemorrhage, mass, midline shift, or extra-axial fluid collection. Patchy hypodensities in the subcortical and deep cerebral white matter bilaterally are nonspecific but compatible with mild chronic small vessel ischemic disease. Small, chronic infarcts are noted in the cerebellum bilaterally. There is also a punctate, 3 mm focus of hypoattenuation in the left thalamus compatible with a lacunar infarct.  Prior left cataract extraction and a right ocular prosthesis are noted. Cerumen is noted in the left EAC. Mastoid air cells are clear. Mild paranasal sinus mucosal thickening is noted bilaterally. There is moderate calcified atherosclerosis at the skull base.  IMPRESSION: 1. No evidence of acute intracranial hemorrhage or acute large territory infarct. 2. Small infarcts in the cerebellum which appear chronic. Punctate lacunar infarct in the left thalamus of indeterminate age. 3. Mild chronic small vessel ischemic disease and moderate cerebral atrophy.  These results were called by telephone at the time of interpretation on 08/07/2014 at 5:30 pm to Dr. Thad Rangereynolds, who verbally acknowledged these results.   Electronically Signed   By: Sebastian AcheAllen  Grady   On: December 30, 2014 17:31   [place] [facility]  07/19/2014   CLINICAL DATA:  Acute kidney injury.  Diabetes.  Hypertension.  EXAM: RENAL / URINARY TRACT ULTRASOUND COMPLETE  COMPARISON:  None.  FINDINGS: Right Kidney:  Length: 9.4 cm. Echogenicity within normal limits. No mass or hydronephrosis visualized. Possible 3 mm calculus in the upper pole. 1.3 cm lower pole cyst.  Left Kidney:  Length: 10.1 cm. Echogenicity within normal limits.  No mass or hydronephrosis visualized. 6.4 cm exophytic lower pole cyst. This contains an eccentric soft tissue component.  Bladder:  Appears normal for degree of bladder distention.  IMPRESSION: 1. No hydronephrosis. 2. 6.4 cm complex lower pole left renal cyst with a soft tissue component. This is concerning for the possibility of a neoplasm. This could be further evaluated with MRI of the kidneys, preferably without and with contrast on an outpatient basis if the patient's renal function returns to an acceptable level. 3. 3 mm probable upper pole right renal calculus.   Electronically Signed   By: Beckie SaltsSteven  Reid M.D.   On: 07/19/2014 10:14   Dg Chest Port 1 View  07/19/2014   CLINICAL DATA:  Short of breath.  Follow-up exam.  EXAM: PORTABLE CHEST - 1 VIEW  COMPARISON:  December 30, 2014.  FINDINGS: Stable  enlargement of the cardiopericardial silhouette. No mediastinal or hilar masses. Lungs are hyperexpanded with thickened interstitial markings bilaterally, stable. No lung consolidation or convincing edema. No pleural effusion or pneumothorax.  IMPRESSION: 1. No convincing acute cardiopulmonary disease. 2. Cardiomegaly. Lung hyperexpansion interstitial thickening, chronic, consistent with COPD.   Electronically Signed   By: Amie Portland M.D.   On: 07/19/2014 08:17   Dg Chest Port 1 View  Jul 25, 2014   CLINICAL DATA:  Elevated troponin.  EXAM: PORTABLE CHEST - 1 VIEW  COMPARISON:  02/20/2014.  FINDINGS: Stable enlarged cardiac silhouette, hyperexpanded lungs and diffusely prominent interstitial markings. Bilateral shoulder degenerative changes with superior migration of both humeral heads and bony remodeling on the right.  IMPRESSION: 1. Stable cardiomegaly and changes of COPD. 2. Bilateral shoulder degenerative changes and chronic rotator cuff tears.   Electronically Signed   By: Beckie Salts M.D.   On: 07-25-2014 19:07    Medications:  I have reviewed the patient's current medications. Scheduled: . aspirin  300  mg Rectal Daily  . Fluticasone Furoate-Vilanterol  1 puff Inhalation q morning - 10a  . ipratropium-albuterol  3 mL Nebulization QID  . methylPREDNISolone (SOLU-MEDROL) injection  40 mg Intravenous Q12H  . metoprolol  5 mg Intravenous Once  . piperacillin-tazobactam (ZOSYN)  IV  2.25 g Intravenous 4 times per day  . [START ON 07/20/2014] vancomycin  1,000 mg Intravenous Q48H    Assessment/Plan: Patient moving all extremities strongly and verbally fluent overnight.  Improved from initial presentation when hypoglycemic.  Head CT at that time showed no acute changes and examination now nonfocal.  Do not suspect acute infarct.  Presentation likely metabolic.  Overnight patient likely with sundowning.  Do not suspect CNS infection at this time therefore LP not indicated.  Patient afebrile with no elevated white blood cell count.  On Zosyn and Vanc.  Recommendations: 1.  Agree with addressing etiology of hypoglycemia 2.  Continue ASA 2.  No further neurologic intervention is recommended at this time.  If further questions arise, please call or page at that time.  Thank you for allowing neurology to participate in the care of this patient.      LOS: 1 day   Thana Farr, MD [facility] [phone] 07/19/2014  10:37 AM

## 2014-07-20 ENCOUNTER — Inpatient Hospital Stay (HOSPITAL_COMMUNITY): Payer: Medicare Other

## 2014-07-20 DIAGNOSIS — N189 Chronic kidney disease, unspecified: Secondary | ICD-10-CM

## 2014-07-20 LAB — VOLATILES,BLD-ACETONE,ETHANOL,ISOPROP,METHANOL
ACETONE, BLOOD: NEGATIVE % (ref 0.000–0.010)
ETHANOL, BLOOD: NEGATIVE % (ref 0.000–0.010)
Isopropanol, blood: NEGATIVE % (ref 0.000–0.010)
Methanol, blood: NEGATIVE % (ref 0.000–0.010)

## 2014-07-20 LAB — COMPREHENSIVE METABOLIC PANEL
ALBUMIN: 2.8 g/dL — AB (ref 3.5–5.0)
ALK PHOS: 59 U/L (ref 38–126)
ALT: 110 U/L — AB (ref 17–63)
ALT: 92 U/L — ABNORMAL HIGH (ref 17–63)
AST: 276 U/L — ABNORMAL HIGH (ref 15–41)
AST: 161 U/L — AB (ref 15–41)
Albumin: 2.8 g/dL — ABNORMAL LOW (ref 3.5–5.0)
Alkaline Phosphatase: 60 U/L (ref 38–126)
Anion gap: 15 (ref 5–15)
Anion gap: 14 (ref 5–15)
BUN: 77 mg/dL — ABNORMAL HIGH (ref 6–20)
BUN: 79 mg/dL — ABNORMAL HIGH (ref 6–20)
CO2: 12 mmol/L — ABNORMAL LOW (ref 22–32)
CO2: 17 mmol/L — AB (ref 22–32)
Calcium: 8.4 mg/dL — ABNORMAL LOW (ref 8.9–10.3)
Calcium: 8.8 mg/dL — ABNORMAL LOW (ref 8.9–10.3)
Chloride: 113 mmol/L — ABNORMAL HIGH (ref 101–111)
Chloride: 113 mmol/L — ABNORMAL HIGH (ref 101–111)
Creatinine, Ser: 2.16 mg/dL — ABNORMAL HIGH (ref 0.61–1.24)
Creatinine, Ser: 1.95 mg/dL — ABNORMAL HIGH (ref 0.61–1.24)
GFR calc Af Amer: 33 mL/min — ABNORMAL LOW (ref >60)
GFR calc non Af Amer: 25 mL/min — ABNORMAL LOW (ref >60)
GFR calc non Af Amer: 28 mL/min — ABNORMAL LOW (ref >60)
GFR, EST AFRICAN AMERICAN: 29 mL/min — AB (ref >60)
GLUCOSE: 148 mg/dL — AB (ref 70–99)
GLUCOSE: 177 mg/dL — AB (ref 70–99)
POTASSIUM: 4 mmol/L (ref 3.5–5.1)
Potassium: 4.7 mmol/L (ref 3.5–5.1)
SODIUM: 140 mmol/L (ref 135–145)
SODIUM: 144 mmol/L (ref 135–145)
TOTAL PROTEIN: 7 g/dL (ref 6.5–8.1)
TOTAL PROTEIN: 6.7 g/dL (ref 6.5–8.1)
Total Bilirubin: 2.6 mg/dL — ABNORMAL HIGH (ref 0.3–1.2)
Total Bilirubin: 2.4 mg/dL — ABNORMAL HIGH (ref 0.3–1.2)

## 2014-07-20 LAB — FIBRINOGEN: Fibrinogen: 143 mg/dL — ABNORMAL LOW (ref 204–475)

## 2014-07-20 LAB — GLUCOSE, CAPILLARY
GLUCOSE-CAPILLARY: 177 mg/dL — AB (ref 70–99)
Glucose-Capillary: 145 mg/dL — ABNORMAL HIGH (ref 70–99)
Glucose-Capillary: 160 mg/dL — ABNORMAL HIGH (ref 70–99)

## 2014-07-20 LAB — BLOOD GAS, ARTERIAL
Acid-base deficit: 4.8 mmol/L — ABNORMAL HIGH (ref 0.0–2.0)
Bicarbonate: 19.3 mEq/L — ABNORMAL LOW (ref 20.0–24.0)
Drawn by: 36277
FIO2: 55 %
O2 Saturation: 96.8 %
PATIENT TEMPERATURE: 97.6
PH ART: 7.392 (ref 7.350–7.450)
TCO2: 20.3 mmol/L (ref 0–100)
pCO2 arterial: 32.3 mmHg — ABNORMAL LOW (ref 35.0–45.0)
pO2, Arterial: 86.9 mmHg (ref 80.0–100.0)

## 2014-07-20 LAB — PROTIME-INR
INR: 2.09 — ABNORMAL HIGH (ref 0.00–1.49)
Prothrombin Time: 23.6 seconds — ABNORMAL HIGH (ref 11.6–15.2)

## 2014-07-20 LAB — CBC
HEMATOCRIT: 33.1 % — AB (ref 39.0–52.0)
Hemoglobin: 10.8 g/dL — ABNORMAL LOW (ref 13.0–17.0)
MCH: 30.2 pg (ref 26.0–34.0)
MCHC: 32.6 g/dL (ref 30.0–36.0)
MCV: 92.5 fL (ref 78.0–100.0)
PLATELETS: 80 10*3/uL — AB (ref 150–400)
RBC: 3.58 MIL/uL — ABNORMAL LOW (ref 4.22–5.81)
RDW: 15.6 % — AB (ref 11.5–15.5)
WBC: 6.7 10*3/uL (ref 4.0–10.5)

## 2014-07-20 LAB — HEMOGLOBIN A1C
HEMOGLOBIN A1C: 6.7 % — AB (ref 4.8–5.6)
MEAN PLASMA GLUCOSE: 146 mg/dL

## 2014-07-20 LAB — PROCALCITONIN: PROCALCITONIN: 5.74 ng/mL

## 2014-07-20 MED ORDER — METHYLPREDNISOLONE SODIUM SUCC 40 MG IJ SOLR
40.0000 mg | Freq: Every day | INTRAMUSCULAR | Status: DC
  Administered 2014-07-20 – 2014-07-21 (×2): 40 mg via INTRAVENOUS
  Filled 2014-07-20 (×2): qty 1

## 2014-07-20 MED ORDER — CETYLPYRIDINIUM CHLORIDE 0.05 % MT LIQD
7.0000 mL | Freq: Two times a day (BID) | OROMUCOSAL | Status: DC
  Administered 2014-07-20 – 2014-08-01 (×25): 7 mL via OROMUCOSAL

## 2014-07-20 MED ORDER — SODIUM CHLORIDE 0.9 % IV SOLN
INTRAVENOUS | Status: DC
  Administered 2014-07-20: 22:00:00 via INTRAVENOUS

## 2014-07-20 MED ORDER — CHLORHEXIDINE GLUCONATE 0.12 % MT SOLN
15.0000 mL | Freq: Two times a day (BID) | OROMUCOSAL | Status: DC
  Administered 2014-07-20 – 2014-08-01 (×26): 15 mL via OROMUCOSAL
  Filled 2014-07-20 (×24): qty 15

## 2014-07-20 MED ORDER — VITAMIN K1 10 MG/ML IJ SOLN
10.0000 mg | Freq: Once | INTRAVENOUS | Status: AC
  Administered 2014-07-20: 10 mg via INTRAVENOUS
  Filled 2014-07-20: qty 1

## 2014-07-20 NOTE — Progress Notes (Signed)
PULMONARY / CRITICAL CARE MEDICINE   Name: Danny [name] L Raborn MRN: [id number] DOB: April 19, 1920    ADMISSION DATE:  07/22/2014 CONSULTATION DATE:  07/19/2014  REFERRING MD :  Waymon AmatoHongalgi  CHIEF COMPLAINT:  Confusion  INITIAL PRESENTATION:  81 yo male smoker with weakness, hypothermia, hypoglycemia, AKI, metabolic acidosis with respiratory alkalosis, and possible sepsis.  STUDIES:  5/07 CT head >> chronic cerebellum and Lt thalamus infarct 5/07 Echo >> EF 20-25%, moD AS/AI, RVSP 65 5/07 Renal u/s >> Markedly thickened gallbladder wall to 14 mm thick with edema/striations within wall without gallstones ; findings are concerning for acalculus cholecystitis  SIGNIFICANT EVENTS: 5/07 Admit, neuro consulted   HISTORY OF PRESENT ILLNESS:   81 yo male was feeling weak.  His family noted he had slurred speech and Rt facial droop.  He was seen in ER with concern for CVA.  He was noted to be hypothermic and blood sugar was 17.  He also had AKI, metabolic acidosis with elevated lactic acid, and hypotension.  He was seen by stroke service.  He was started on Abx, and given dextrose.  He was started on warming blanket.  He was admitted to SDU.  He developed agitation and received several doses of ativan, morphine, and haldol.   SUBJECTIVE: Agitated requiring restraints Now sedated on precedex gtt On venti mask  VITAL SIGNS: Temp:  [94.7 F (34.8 C)-99.1 F (37.3 C)] 98.3 F (36.8 C) (05/09 0745) Pulse Rate:  [29-119] 71 (05/09 0730) Resp:  [11-28] 24 (05/09 0730) BP: (96-163)/(57-95) 133/83 mmHg (05/09 0730) SpO2:  [87 %-100 %] 98 % (05/09 0838) FiO2 (%):  [55 %] 55 % (05/09 0838) INTAKE / OUTPUT:  Intake/Output Summary (Last 24 hours) at 07/20/14 0927 Last data filed at 07/20/14 0700  Gross per 24 hour  Intake 2745.25 ml  Output    735 ml  Net 2010.25 ml    PHYSICAL EXAMINATION: General: ill appearing, cachetic Neuro:  Obtunded, RASS-2, mumbles and moves extremities with  stimulation HEENT: dry oral mucosa Cardiovascular:  Irregular, tachycardic Lungs:  Decreased breath sounds, basilar crackles Abdomen:  Soft, mild epigastric tenderness, no rebound/guarding Musculoskeletal:  Decreased muscle bulk Skin:  No rashes  LABS:  CBC  Recent Labs Lab 07/19/14 0430 07/19/14 1031 07/19/14 1713 07/20/14 0530  WBC 10.1  --  8.5 6.7  HGB 11.4*  --  12.0* 10.8*  HCT 35.9*  --  37.2* 33.1*  PLT 103* 97* 92* 80*   Coag's  Recent Labs Lab 2014/08/03 1639 2014/08/03 2301 07/19/14 1031 07/20/14 0530  APTT 32 43* 49*  --   INR 2.85* 3.51* 3.15* 2.09*   BMET  Recent Labs Lab 07/19/14 0430 07/19/14 1713 07/20/14 0530  NA 141 140 144  K 5.1 4.7 4.0  CL 108 113* 113*  CO2 17* 12* 17*  BUN 64* 77* 79*  CREATININE 2.15* 2.16* 1.95*  GLUCOSE 166* 148* 177*   Electrolytes  Recent Labs Lab 07/19/14 0430 07/19/14 1713 07/20/14 0530  CALCIUM 8.4* 8.4* 8.8*   Sepsis Markers  Recent Labs Lab 2014/08/03 2220 2014/08/03 2301 2014/08/03 2318 07/19/14 0200 07/19/14 1031  LATICACIDVEN  --   --  11.1* 7.7* 2.7*  PROCALCITON 2.30 2.53  --   --   --    ABG  Recent Labs Lab 07/19/14 0913 07/19/14 1558 07/20/14 0420  PHART 7.377 7.353 7.392  PCO2ART 29.1* 31.8* 32.3*  PO2ART 113* 115* 86.9   Liver Enzymes  Recent Labs Lab 07/19/14 1030 07/19/14 1713 07/20/14 0530  AST 388* 276* 161*  ALT 110* 110* 92*  ALKPHOS 63 60 59  BILITOT 3.1* 2.6* 2.4*  ALBUMIN 2.8* 2.8* 2.8*   Cardiac Enzymes  Recent Labs Lab 2014-07-23 2301 07/19/14 0430 07/19/14 1030  TROPONINI 0.44* 0.53* 0.63*   Glucose  Recent Labs Lab 2014/07/23 2359 07/19/14 0331 07/19/14 0546 07/19/14 1927 07/19/14 2334 07/20/14 0404  GLUCAP 141* 163* 160* 130* 145* 160*    Imaging US Renal  07/19/2014   CLINICAL DATA:  Acute kidney injury.  Diabetes.  Hypertension.  EXAM: RENAL / URINARY TRACT ULTRASOUND COMPLETE  COMPARISON:  None.  FINDINGS: Right Kidney:  Length: 9.4 cm.  Echogenicity within normal limits. No mass or hydronephrosis visualized. Possible 3 mm calculus in the upper pole. 1.3 cm lower pole cyst.  Left Kidney:  Length: 10.1 cm. Echogenicity within normal limits. No mass or hydronephrosis visualized. 6.4 cm exophytic lower pole cyst. This contains an eccentric soft tissue component.  Bladder:  Appears normal for degree of bladder distention.  IMPRESSION: 1. No hydronephrosis. 2. 6.4 cm complex lower pole left renal cyst with a soft tissue component. This is concerning for the possibility of a neoplasm. This could be further evaluated with MRI of the kidneys, preferably without and with contrast on an outpatient basis if the patient's renal function returns to an acceptable level. 3. 3 mm probable upper pole right renal calculus.   Electronically Signed   By: Beckie Salts M.D.   On: 07/19/2014 10:14   US Abdomen Port  07/19/2014   CLINICAL DATA:  Abnormal LFTs, history hypertension, diabetes, hyperlipidemia, renal insufficiency  EXAM: ULTRASOUND PORTABLE ABDOMEN  COMPARISON:  Renal ultrasound 07/19/2014  FINDINGS: Gallbladder: Markedly thickened gallbladder wall up to 14 mm thick. Edema within wall. No definite shadowing calculi identified. Unable to assess for presence of a sonographic Murphy sign due to patient being combative, confused and noncompliant with imaging.  Common bile duct: Diameter: 6 mm diameter, normal for age  Liver: No focal abnormalities. Hepatopetal portal venous flow. Slightly inhomogeneous with question slightly increased echogenicity, cannot exclude fatty infiltration.  IVC: Normal appearance  Pancreas: Normal appearance  Spleen: Small and minimally lobular, 6.3 cm length. No focal abnormalities  Right Kidney: Length: 5.5 cm. Thinned cortex. Small cyst at inferior pole 16 x 10 x 9 mm. No additional mass or hydronephrosis.  Left Kidney: Inadequately visualized due to patient noncompliance with imaging. This was adequately visualized on the earlier  renal ultrasound of same date.  Abdominal aorta: Proximally normal caliber, mid to distal portions obscured by bowel gas.  Other findings: Small LEFT pleural effusion. Small amount of perisplenic ascites.  IMPRESSION: Small LEFT pleural effusion and small amount of perisplenic ascites.  Inadequate visualization of LEFT kidney due to patient condition, please see earlier renal ultrasound exam lobe same date.  Minimal fatty infiltration of liver  Markedly thickened gallbladder wall to 14 mm thick with edema/striations within wall without gallstones ; findings are concerning for acalculus cholecystitis.   Electronically Signed   By: Ulyses Southward M.D.   On: 07/19/2014 19:43   Dg Chest Port 1 View  07/19/2014   CLINICAL DATA:  Short of breath.  Follow-up exam.  EXAM: PORTABLE CHEST - 1 VIEW  COMPARISON:  07-23-14.  FINDINGS: Stable enlargement of the cardiopericardial silhouette. No mediastinal or hilar masses. Lungs are hyperexpanded with thickened interstitial markings bilaterally, stable. No lung consolidation or convincing edema. No pleural effusion or pneumothorax.  IMPRESSION: 1. No convincing acute cardiopulmonary disease. 2. Cardiomegaly. Lung  hyperexpansion interstitial thickening, chronic, consistent with COPD.   Electronically Signed   By: Amie Portlandavid  Ormond M.D.   On: 07/19/2014 08:17     ASSESSMENT / PLAN:  PULMONARY A: AECOPD. P:   F/u CXR, ABG Change to brovana, pulmicort with prn albuterol Oxygen to keep SpO2 > 92% Solumedrol -drop to 40 daily  CARDIOVASCULAR A:  Sepsis. Hx of CAD, chronic systolic CHF (EF 25 to 30% from 05/11/13). PAF. P:    RENAL A:   AKI >> baseline creatinine 1.29 from 02/25/14-FENa< 1 s/o pre renal Anion gap metabolic acidosis -resolving. Lactic acidosis -resolved Osmolar gap >> measured osmolarity 325, calculated 300. P:   ct IV fluids Check ethylene glycol, methanol for completion, but doubt  GASTROINTESTINAL A:   Abdominal pain. Elevated lFTs  -improving, concern for acalculous cholecystitis  Lipase nml P:   NPO Since improving, does not need drain   HEMATOLOGIC A:   Mild thrombocytopenia. Coagulopathy -improving with FFP. P:  Vit K x1 SCD's  INFECTIOUS A:   Concern for sepsis ?source - acalculous chole P:   Day 2 vancomycin, zosyn  Blood 5/07 >>   ENDOCRINE A:   Hypoglycemia- TSH, cortisol ok P:   Dextrose in IV fluid   NEUROLOGIC A:   Acute encephalopathy with concern for CVA - prob metabolic P:   Hold ASA for now  F/u with neuro Try to minimize precedex   The patient is critically ill with multiple organ systems failure and requires high complexity decision making for assessment and support, frequent evaluation and titration of therapies, application of advanced monitoring technologies and extensive interpretation of multiple databases. Critical Care Time devoted to patient care services described in this note independent of APP time is 35 minutes.    Cyril Mourningakesh Lezlee Gills MD. Tonny BollmanFCCP. [place] Pager [phone] If no response call [phone]    07/20/2014, 9:27 AM

## 2014-07-21 LAB — GLUCOSE, CAPILLARY
Glucose-Capillary: 171 mg/dL — ABNORMAL HIGH (ref 70–99)
Glucose-Capillary: 193 mg/dL — ABNORMAL HIGH (ref 70–99)
Glucose-Capillary: 209 mg/dL — ABNORMAL HIGH (ref 70–99)
Glucose-Capillary: 243 mg/dL — ABNORMAL HIGH (ref 70–99)

## 2014-07-21 LAB — PREPARE FRESH FROZEN PLASMA
UNIT DIVISION: 0
Unit division: 0
Unit division: 0
Unit division: 0

## 2014-07-21 LAB — PROTIME-INR
INR: 1.87 — ABNORMAL HIGH (ref 0.00–1.49)
PROTHROMBIN TIME: 21.7 s — AB (ref 11.6–15.2)

## 2014-07-21 LAB — CBC
HCT: 38.6 % — ABNORMAL LOW (ref 39.0–52.0)
HEMOGLOBIN: 12.4 g/dL — AB (ref 13.0–17.0)
MCH: 30 pg (ref 26.0–34.0)
MCHC: 32.1 g/dL (ref 30.0–36.0)
MCV: 93.2 fL (ref 78.0–100.0)
PLATELETS: 72 10*3/uL — AB (ref 150–400)
RBC: 4.14 MIL/uL — ABNORMAL LOW (ref 4.22–5.81)
RDW: 16 % — ABNORMAL HIGH (ref 11.5–15.5)
WBC: 8.7 10*3/uL (ref 4.0–10.5)

## 2014-07-21 LAB — COMPREHENSIVE METABOLIC PANEL
ALBUMIN: 2.5 g/dL — AB (ref 3.5–5.0)
ALT: 104 U/L — ABNORMAL HIGH (ref 17–63)
AST: 142 U/L — AB (ref 15–41)
Alkaline Phosphatase: 56 U/L (ref 38–126)
Anion gap: 9 (ref 5–15)
BILIRUBIN TOTAL: 2.6 mg/dL — AB (ref 0.3–1.2)
BUN: 77 mg/dL — ABNORMAL HIGH (ref 6–20)
CHLORIDE: 115 mmol/L — AB (ref 101–111)
CO2: 21 mmol/L — ABNORMAL LOW (ref 22–32)
Calcium: 8.9 mg/dL (ref 8.9–10.3)
Creatinine, Ser: 2.15 mg/dL — ABNORMAL HIGH (ref 0.61–1.24)
GFR calc Af Amer: 29 mL/min — ABNORMAL LOW (ref >60)
GFR calc non Af Amer: 25 mL/min — ABNORMAL LOW (ref >60)
Glucose, Bld: 220 mg/dL — ABNORMAL HIGH (ref 70–99)
Potassium: 3.6 mmol/L (ref 3.5–5.1)
SODIUM: 145 mmol/L (ref 135–145)
Total Protein: 6.5 g/dL (ref 6.5–8.1)

## 2014-07-21 MED ORDER — DEXTROSE-NACL 5-0.45 % IV SOLN
INTRAVENOUS | Status: DC
  Administered 2014-07-21: 11:00:00 via INTRAVENOUS

## 2014-07-21 MED ORDER — METHYLPREDNISOLONE SODIUM SUCC 40 MG IJ SOLR
20.0000 mg | Freq: Every day | INTRAMUSCULAR | Status: DC
  Administered 2014-07-22: 20 mg via INTRAVENOUS
  Filled 2014-07-21: qty 0.5

## 2014-07-21 NOTE — Progress Notes (Signed)
PULMONARY / CRITICAL CARE MEDICINE   Name: Danny [name] MRN: [id number] DOB: [birth year]    ADMISSION DATE:  08/01/2014 CONSULTATION DATE:  07/19/2014  REFERRING MD :  Waymon AmatoHongalgi  CHIEF COMPLAINT:  Confusion  INITIAL PRESENTATION:  81 yo male smoker with weakness, hypothermia, hypoglycemia, AKI, metabolic acidosis with respiratory alkalosis, and possible sepsis.  STUDIES:  5/07 CT head >> chronic cerebellum and Lt thalamus infarct 5/07 Echo >> EF 20-25%, moD AS/AI, RVSP 65 5/07 Renal u/s >> Markedly thickened gallbladder wall to 14 mm thick with edema/striations within wall without gallstones ; findings are concerning for acalculus cholecystitis  SIGNIFICANT EVENTS: 5/07 Admit, neuro consulted   HISTORY OF PRESENT ILLNESS:   81 yo male was feeling weak.  His family noted he had slurred speech and Rt facial droop.  He was seen in ER with concern for CVA.  He was noted to be hypothermic and blood sugar was 17.  He also had AKI, metabolic acidosis with elevated lactic acid, and hypotension.  He was seen by stroke service.  He was started on Abx, and given dextrose.  He was started on warming blanket.  He was admitted to SDU.  He developed agitation and received several doses of ativan, morphine, and haldol.   SUBJECTIVE: remains Agitated requiring restraints On lower dose  precedex gtt On [place] VITAL SIGNS: Temp:  [96.4 F (35.8 C)-98.4 F (36.9 C)] 97.3 F (36.3 C) (05/10 0815) Pulse Rate:  [35-91] 84 (05/10 0900) Resp:  [17-30] 18 (05/10 0900) BP: (112-151)/(53-99) 143/89 mmHg (05/10 0900) SpO2:  [88 %-100 %] 95 % (05/10 0900) FiO2 (%):  [55 %] 55 % (05/10 0250) INTAKE / OUTPUT:  Intake/Output Summary (Last 24 hours) at 07/21/14 0930 Last data filed at 07/21/14 0800  Gross per 24 hour  Intake 1602.75 ml  Output   1225 ml  Net 377.75 ml    PHYSICAL EXAMINATION: General: ill appearing, cachetic Neuro:   RASS-2 to +2, mumbles and moves extremities , follos 1 step  commands HEENT: dry oral mucosa Cardiovascular:  Irregular, tachycardic Lungs:  Decreased breath sounds, basilar crackles Abdomen:  Soft, mild epigastric tenderness, no rebound/guarding Musculoskeletal:  Decreased muscle bulk Skin:  No rashes  LABS:  CBC  Recent Labs Lab 07/19/14 1713 07/20/14 0530 07/21/14 0250  WBC 8.5 6.7 8.7  HGB 12.0* 10.8* 12.4*  HCT 37.2* 33.1* 38.6*  PLT 92* 80* 72*   Coag's  Recent Labs Lab 07/17/2014 1639 07/17/2014 2301 07/19/14 1031 07/20/14 0530 07/21/14 0250  APTT 32 43* 49*  --   --   INR 2.85* 3.51* 3.15* 2.09* 1.87*   BMET  Recent Labs Lab 07/19/14 1713 07/20/14 0530 07/21/14 0250  NA 140 144 145  K 4.7 4.0 3.6  CL 113* 113* 115*  CO2 12* 17* 21*  BUN 77* 79* 77*  CREATININE 2.16* 1.95* 2.15*  GLUCOSE 148* 177* 220*   Electrolytes  Recent Labs Lab 07/19/14 1713 07/20/14 0530 07/21/14 0250  CALCIUM 8.4* 8.8* 8.9   Sepsis Markers  Recent Labs Lab 07/17/2014 2220 07/20/2014 2301 08/09/2014 2318 07/19/14 0200 07/19/14 1031 07/20/14 0530  LATICACIDVEN  --   --  11.1* 7.7* 2.7*  --   PROCALCITON 2.30 2.53  --   --   --  5.74   ABG  Recent Labs Lab 07/19/14 0913 07/19/14 1558 07/20/14 0420  PHART 7.377 7.353 7.392  PCO2ART 29.1* 31.8* 32.3*  PO2ART 113* 115* 86.9   Liver Enzymes  Recent Labs Lab 07/19/14 1713 07/20/14  0530 07/21/14 0250  AST 276* 161* 142*  ALT 110* 92* 104*  ALKPHOS 60 59 56  BILITOT 2.6* 2.4* 2.6*  ALBUMIN 2.8* 2.8* 2.5*   Cardiac Enzymes  Recent Labs Lab 2014/06/06 2301 07/19/14 0430 07/19/14 1030  TROPONINI 0.44* 0.53* 0.63*   Glucose  Recent Labs Lab 07/19/14 1927 07/19/14 2334 07/20/14 0404 07/20/14 1213 07/21/14 0013 07/21/14 0659  GLUCAP 130* 145* 160* 177* 209* 171*    Imaging Dg Chest Port 1 View  07/20/2014   CLINICAL DATA:  Respiratory failure .  EXAM: PORTABLE CHEST - 1 VIEW  COMPARISON:  07/19/2014.  FINDINGS: Mediastinum hilar structures are normal.  Cardiomegaly with diffuse bilateral pulmonary progressive infiltrates. Bilateral pleural effusions are present on today's exam. No pneumothorax.  IMPRESSION: Cardiomegaly with diffuse pulmonary infiltrates. Bilateral pleural effusions. These findings are consistent with congestive heart failure.   Electronically Signed   By: Maisie Fushomas  Register   On: 07/20/2014 07:45     ASSESSMENT / PLAN:  PULMONARY A: AECOPD. P:   Change to brovana, pulmicort with prn albuterol Oxygen to keep SpO2 > 92% Solumedrol -drop to 20 daily  CARDIOVASCULAR A:  Sepsis. Hx of CAD, chronic systolic CHF (EF 25 to 30% from 05/11/13). PAF. P: Lasix if breathing worse   RENAL A:   AKI >> baseline creatinine 1.29 from 02/25/14-FENa< 1 s/o pre renal Anion gap metabolic acidosis -resolving. Lactic acidosis -resolved Osmolar gap >> measured osmolarity 325, calculated 300. P:   ct 1/2 NS @ 50/h   GASTROINTESTINAL A:   Abdominal pain. Elevated lFTs -improving, concern for acalculous cholecystitis  Lipase nml P:   NPO - may have to address nutrition if MS does not improve in 24h Since improving, does not need GB drain   HEMATOLOGIC A:   Mild thrombocytopenia. Coagulopathy -improving with FFP. P:  SCD's  INFECTIOUS A:   Concern for sepsis ?source - acalculous chole P:   5/8 vancomycin, zosyn  Blood 5/07 >>   ENDOCRINE A:   Hypoglycemia- TSH, cortisol ok P:   monitor   NEUROLOGIC A:   Acute encephalopathy with concern for CVA - prob metabolic P:   Resume ASA  Try to minimize precedex   The patient is critically ill with multiple organ systems failure and requires high complexity decision making for assessment and support, frequent evaluation and titration of therapies, application of advanced monitoring technologies and extensive interpretation of multiple databases. Critical Care Time devoted to patient care services described in this note independent of APP time is 35 minutes.     Cyril Mourningakesh Caitlin Hillmer MD. Tonny BollmanFCCP. [facility] Pager [phone] If no response call [phone]    07/21/2014, 9:30 AM

## 2014-07-21 NOTE — Progress Notes (Signed)
Utilization review completed. Shirlena Brinegar, RN, BSN. 

## 2014-07-21 NOTE — Progress Notes (Addendum)
Initial Nutrition Assessment  DOCUMENTATION CODES:  Non-severe (moderate) malnutrition in context of chronic illness  INTERVENTION:   (Advance diet vs initiate nutrition support, RD to follow for care plan)  NUTRITION DIAGNOSIS:  Inadequate oral intake related to inability to eat, lethargy/confusion as evidenced by NPO status  GOAL:  Patient will meet greater than or equal to 90% of their needs  MONITOR:  Diet advancement, PO intake, Labs, Weight trends, I & O's  REASON FOR ASSESSMENT:  Low Braden    ASSESSMENT: 81 yo male smoker with weakness, hypothermia, hypoglycemia, AKI, metabolic acidosis with respiratory alkalosis, and possible sepsis.  RD unable to obtain nutrition hx.  On Precedex drip.  No family at bedside.  Limited nutrition focused physical exam completed.  Pt with moderate muscle and fat loss.  Identified with malnutrition.  Pt may need short term nutrition support.  Low braden score places patient at risk for skin breakdown.  Height:  Ht Readings from Last 1 Encounters:  08/06/2014 5\' 8"  (1.727 m)    Weight:  Wt Readings from Last 1 Encounters:  07/19/14 137 lb 2 oz (62.2 kg)    Ideal Body Weight:  70 kg  Wt Readings from Last 10 Encounters:  07/19/14 137 lb 2 oz (62.2 kg)  03/31/14 133 lb (60.328 kg)  03/12/14 126 lb (57.153 kg)  02/25/14 128 lb (58.06 kg)  05/11/13 128 lb 11.2 oz (58.378 kg)  11/25/12 132 lb (59.875 kg)  01/11/12 134 lb (60.782 kg)  01/10/11 145 lb 8 oz (65.998 kg)  12/06/10 147 lb (66.679 kg)  05/16/10 148 lb (67.132 kg)    BMI:  Body mass index is 20.85 kg/(m^2).  Estimated Nutritional Needs:  Kcal:  1500-1700  Protein:  90-100 gm  Fluid:  1.5-1.7 L  Skin:  Reviewed, no issues  Diet Order:  Diet NPO time specified Except for: Sips with Meds  EDUCATION NEEDS:  No education needs identified at this time   Intake/Output Summary (Last 24 hours) at 07/21/14 1209 Last data filed at 07/21/14 1000  Gross per  24 hour  Intake 1401.2 ml  Output    995 ml  Net  406.2 ml    Last BM:  unknown  Maureen ChattersKatie Elzy Tomasello, RD, LDN Pager #: [phone] After-Hours Pager #: [phone]

## 2014-07-21 NOTE — Progress Notes (Signed)
ANTIBIOTIC CONSULT NOTE   Pharmacy Consult for Vancocin and Zosyn Indication: rule out sepsis  No Known Allergies  Patient Measurements: Height: 5\' 8"  (172.7 cm) Weight: 137 lb 2 oz (62.2 kg) IBW/kg (Calculated) : 68.4  Vital Signs: Temp: 97.3 F (36.3 C) (05/10 0815) Temp Source: Axillary (05/10 0815) BP: 145/51 mmHg (05/10 1100) Pulse Rate: 131 (05/10 1100)  Labs:  Recent Labs  07/19/14 1030  07/19/14 1713 07/20/14 0530 07/21/14 0250  WBC  --   --  8.5 6.7 8.7  HGB  --   --  12.0* 10.8* 12.4*  PLT  --   < > 92* 80* 72*  LABCREA 68.32  --   --   --   --   CREATININE  --   --  2.16* 1.95* 2.15*  < > = values in this interval not displayed. Estimated Creatinine Clearance: 19.7 mL/min (by C-G formula based on Cr of 2.15).  Medical History: Past Medical History  Diagnosis Date  . DIABETES MELLITUS, TYPE II 12/06/2006  . NEPHROPATHY, DIABETIC 12/06/2006  . HYPERLIPIDEMIA 12/06/2006  . HYPOKALEMIA 01/12/2009  . TOBACCO USER 05/13/2009  . HYPERTENSION 12/06/2006  . CORONARY ATHEROSCLEROSIS, NATIVE VESSEL 05/13/2009  . Atrial fibrillation 05/04/2009  . COPD 12/06/2006  . GERD 12/06/2006  . DIVERTICULOSIS, COLON 12/06/2006  . CONSTIPATION 12/20/2007  . RECTAL BLEEDING 12/20/2007  . RENAL INSUFFICIENCY 12/20/2007  . BACK PAIN, LUMBAR 01/21/2008  . DIZZINESS OR VERTIGO 12/06/2006  . ABNORMAL EKG 05/13/2009  . Diastolic dysfunction   . Blindness of right eye     Medications:  Prescriptions prior to admission  Medication Sig Dispense Refill Last Dose  . albuterol (PROVENTIL HFA;VENTOLIN HFA) 108 (90 BASE) MCG/ACT inhaler Inhale 1-2 puffs into the lungs every 6 (six) hours as needed for wheezing or shortness of breath. 1 Inhaler 0 Past Week at Unknown time  . aspirin 81 MG tablet Take 81 mg by mouth every evening.    07/17/2014 at Unknown time  . dextromethorphan-guaiFENesin (MUCINEX DM) 30-600 MG per 12 hr tablet Take 1 tablet by mouth 2 (two) times daily as needed for cough.    07/13/2014 at Unknown time  . Fluticasone Furoate-Vilanterol (BREO ELLIPTA) 100-25 MCG/INH AEPB Inhale 1 puff into the lungs every morning. 60 each 0 07/21/2014 at Unknown time  . furosemide (LASIX) 20 MG tablet Take 20 mg by mouth daily.   07/17/2014 at Unknown time  . zolpidem (AMBIEN) 5 MG tablet Take 1 tablet (5 mg total) by mouth at bedtime as needed for sleep. (Patient not taking: Reported on 07/17/2014) 30 tablet 1 Not Taking at Unknown time   Scheduled:  . antiseptic oral rinse  7 mL Mouth Rinse q12n4p  . arformoterol  15 mcg Nebulization BID  . aspirin  300 mg Rectal Daily  . budesonide (PULMICORT) nebulizer solution  0.25 mg Nebulization BID  . chlorhexidine  15 mL Mouth Rinse BID  . [START ON 07/22/2014] methylPREDNISolone (SOLU-MEDROL) injection  20 mg Intravenous Daily  . pantoprazole (PROTONIX) IV  40 mg Intravenous Q24H  . piperacillin-tazobactam (ZOSYN)  IV  2.25 g Intravenous 4 times per day  . vancomycin  1,000 mg Intravenous Q48H   Infusions:  . dexmedetomidine 0.3 mcg/kg/hr (07/21/14 1000)  . dextrose 5 % and 0.45% NaCl 50 mL/hr at 07/21/14 1128    Assessment: 81yo male presents initially as code stroke w/ facial droop and slurred speech after awakening from nap, no acute changes on CT, found to be hypoglycemic and hypothermic, concern for  sepsis w/ metabolic encephalopathy, to continue IV Abx.    Goal of Therapy:  Vancomycin trough level 15-20 mcg/ml  Plan:  Cont vancomycin 1000mg  IV Q48H and Zosyn 2.25g IV Q6H. F/u renal function, cultures and clinical course Vanc trough when appropriate  Thanks for allowing pharmacy to be a part of this patient's care.  Talbert CageLora Phillp Dolores, PharmD Clinical Pharmacist, [phone] 07/21/2014,12:19 PM

## 2014-07-22 ENCOUNTER — Inpatient Hospital Stay (HOSPITAL_COMMUNITY): Payer: Medicare Other

## 2014-07-22 DIAGNOSIS — E440 Moderate protein-calorie malnutrition: Secondary | ICD-10-CM | POA: Insufficient documentation

## 2014-07-22 LAB — GLUCOSE, CAPILLARY
GLUCOSE-CAPILLARY: 161 mg/dL — AB (ref 70–99)
GLUCOSE-CAPILLARY: 148 mg/dL — AB (ref 70–99)
Glucose-Capillary: 139 mg/dL — ABNORMAL HIGH (ref 70–99)
Glucose-Capillary: 147 mg/dL — ABNORMAL HIGH (ref 70–99)
Glucose-Capillary: 156 mg/dL — ABNORMAL HIGH (ref 70–99)
Glucose-Capillary: 192 mg/dL — ABNORMAL HIGH (ref 70–99)

## 2014-07-22 LAB — BASIC METABOLIC PANEL
ANION GAP: 11 (ref 5–15)
Anion gap: 17 — ABNORMAL HIGH (ref 5–15)
BUN: 83 mg/dL — ABNORMAL HIGH (ref 6–20)
BUN: 93 mg/dL — ABNORMAL HIGH (ref 6–20)
CALCIUM: 8.9 mg/dL (ref 8.9–10.3)
CALCIUM: 8.8 mg/dL — AB (ref 8.9–10.3)
CO2: 16 mmol/L — ABNORMAL LOW (ref 22–32)
CO2: 21 mmol/L — ABNORMAL LOW (ref 22–32)
Chloride: 119 mmol/L — ABNORMAL HIGH (ref 101–111)
Chloride: 117 mmol/L — ABNORMAL HIGH (ref 101–111)
Creatinine, Ser: 2.06 mg/dL — ABNORMAL HIGH (ref 0.61–1.24)
Creatinine, Ser: 2.29 mg/dL — ABNORMAL HIGH (ref 0.61–1.24)
GFR calc Af Amer: 31 mL/min — ABNORMAL LOW (ref >60)
GFR calc non Af Amer: 27 mL/min — ABNORMAL LOW (ref >60)
GFR, EST AFRICAN AMERICAN: 27 mL/min — AB (ref >60)
GFR, EST NON AFRICAN AMERICAN: 23 mL/min — AB (ref >60)
GLUCOSE: 200 mg/dL — AB (ref 70–99)
Glucose, Bld: 145 mg/dL — ABNORMAL HIGH (ref 70–99)
Potassium: 3.4 mmol/L — ABNORMAL LOW (ref 3.5–5.1)
Potassium: 4 mmol/L (ref 3.5–5.1)
SODIUM: 152 mmol/L — AB (ref 135–145)
SODIUM: 149 mmol/L — AB (ref 135–145)

## 2014-07-22 LAB — CBC
HCT: 40 % (ref 39.0–52.0)
Hemoglobin: 12.9 g/dL — ABNORMAL LOW (ref 13.0–17.0)
MCH: 30.3 pg (ref 26.0–34.0)
MCHC: 32.3 g/dL (ref 30.0–36.0)
MCV: 93.9 fL (ref 78.0–100.0)
PLATELETS: 53 10*3/uL — AB (ref 150–400)
RBC: 4.26 MIL/uL (ref 4.22–5.81)
RDW: 16.5 % — ABNORMAL HIGH (ref 11.5–15.5)
WBC: 13 10*3/uL — AB (ref 4.0–10.5)

## 2014-07-22 LAB — MAGNESIUM: Magnesium: 2.5 mg/dL — ABNORMAL HIGH (ref 1.7–2.4)

## 2014-07-22 LAB — PROCALCITONIN: Procalcitonin: 2.9 ng/mL

## 2014-07-22 MED ORDER — HALOPERIDOL LACTATE 5 MG/ML IJ SOLN
1.0000 mg | INTRAMUSCULAR | Status: DC | PRN
  Administered 2014-07-26: 2 mg via INTRAVENOUS
  Filled 2014-07-22: qty 1

## 2014-07-22 MED ORDER — VITAL AF 1.2 CAL PO LIQD
1000.0000 mL | ORAL | Status: DC
  Administered 2014-07-22 – 2014-07-31 (×9): 1000 mL
  Filled 2014-07-22 (×15): qty 1000

## 2014-07-22 MED ORDER — VITAL HIGH PROTEIN PO LIQD
1000.0000 mL | ORAL | Status: DC
  Filled 2014-07-22 (×2): qty 1000

## 2014-07-22 MED ORDER — DEXTROSE 5 % IV SOLN
INTRAVENOUS | Status: DC
  Administered 2014-07-22: 10:00:00 via INTRAVENOUS

## 2014-07-22 MED ORDER — INSULIN ASPART 100 UNIT/ML ~~LOC~~ SOLN
0.0000 [IU] | SUBCUTANEOUS | Status: DC
  Administered 2014-07-22: 2 [IU] via SUBCUTANEOUS
  Administered 2014-07-22: 3 [IU] via SUBCUTANEOUS
  Administered 2014-07-22 (×3): 2 [IU] via SUBCUTANEOUS
  Administered 2014-07-22: 1 [IU] via SUBCUTANEOUS
  Administered 2014-07-23: 2 [IU] via SUBCUTANEOUS
  Administered 2014-07-23: 1 [IU] via SUBCUTANEOUS
  Administered 2014-07-23 (×3): 2 [IU] via SUBCUTANEOUS
  Administered 2014-07-24 (×2): 3 [IU] via SUBCUTANEOUS
  Administered 2014-07-24: 2 [IU] via SUBCUTANEOUS
  Administered 2014-07-24: 3 [IU] via SUBCUTANEOUS
  Administered 2014-07-24: 2 [IU] via SUBCUTANEOUS
  Administered 2014-07-24: 3 [IU] via SUBCUTANEOUS
  Administered 2014-07-25 (×3): 2 [IU] via SUBCUTANEOUS
  Administered 2014-07-25: 5 [IU] via SUBCUTANEOUS
  Administered 2014-07-25: 3 [IU] via SUBCUTANEOUS
  Administered 2014-07-25: 1 [IU] via SUBCUTANEOUS
  Administered 2014-07-26: 3 [IU] via SUBCUTANEOUS
  Administered 2014-07-26: 2 [IU] via SUBCUTANEOUS
  Administered 2014-07-26 (×2): 3 [IU] via SUBCUTANEOUS

## 2014-07-22 NOTE — Progress Notes (Addendum)
Nutrition Consult/Follow Up  DOCUMENTATION CODES:  Non-severe (moderate) malnutrition in context of chronic illness  INTERVENTION:  Tube feeding   Initiate Vital AF 1.2 formula at 15 ml/hr and increase by 10 ml every 8 hours to goal rate of 55 ml/hr  Total TF regimen to provide 1584 kcals, 99 gm protein, 1071 ml of free water  NUTRITION DIAGNOSIS:  Inadequate oral intake related to inability to eat, lethargy/confusion as evidenced by NPO status, ongoing  GOAL:  Patient will meet greater than or equal to 90% of their needs, currently unmet  MONITOR:  TF tolerance, Labs, Weight trends, I & O's  ASSESSMENT: 81 yo male smoker with weakness, hypothermia, hypoglycemia, AKI, metabolic acidosis with respiratory alkalosis, and possible sepsis.  Pt remains confused/agitated.  Panda placed -- tip within the stomach, coiled in fundus.  RD consulted for TF initiation & management.  Pt at risk for refeeding syndrome given malnutrition.  Height:  Ht Readings from Last 1 Encounters:  08-06-2014 5\' 8"  (1.727 m)    Weight:  Wt Readings from Last 1 Encounters:  07/23/14 139 lb 5.3 oz (63.2 kg)    Ideal Body Weight:  70 kg  Wt Readings from Last 10 Encounters:  07/23/14 139 lb 5.3 oz (63.2 kg)  03/31/14 133 lb (60.328 kg)  03/12/14 126 lb (57.153 kg)  02/25/14 128 lb (58.06 kg)  05/11/13 128 lb 11.2 oz (58.378 kg)  11/25/12 132 lb (59.875 kg)  01/11/12 134 lb (60.782 kg)  01/10/11 145 lb 8 oz (65.998 kg)  12/06/10 147 lb (66.679 kg)  05/16/10 148 lb (67.132 kg)    BMI:  Body mass index is 21.19 kg/(m^2).  Estimated Nutritional Needs:  Kcal:  1500-1700  Protein:  90-100 gm  Fluid:  1.5-1.7 L  Skin:  Reviewed, no issues  Diet Order:  Diet NPO time specified Except for: Sips with Meds  EDUCATION NEEDS:  No education needs identified at this time   Intake/Output Summary (Last 24 hours) at 07/23/14 1601 Last data filed at 07/23/14 1300  Gross per 24 hour   Intake 2765.35 ml  Output    465 ml  Net 2300.35 ml    Last BM:  unknown  Maureen ChattersKatie Allyana Vogan, RD, LDN Pager #: [phone] After-Hours Pager #: [phone]

## 2014-07-22 NOTE — Progress Notes (Addendum)
PT Cancellation Note  Patient Details Name: Derinda SisJesse L Ruggles MRN: [id number] DOB: Jul 29, 1920   Cancelled Treatment:    Reason Eval/Treat Not Completed: Medical issues which prohibited therapy--continued agitation. Pt calling out currently. RN requested PT defer evaluation at this time.   Fredy Gladu 07/22/2014, 11:44 AM Pager [phone]

## 2014-07-22 NOTE — Progress Notes (Signed)
eLink Physician-Brief Progress Note Patient Name: Derinda SisJesse L Ledwell DOB: [birth year] MRN: [id number]   Date of Service  07/22/2014  HPI/Events of Note  8 beat run of NSVT. K+ = 3.4 and Creatinine = 2.06 this AM. K+ not repleted. LVEF know to be 20% to 25%. Frequent ectopy is not a new finding in this patient.  eICU Interventions  Will order: 1. BMP and Magnesium level now.   Will consider escalation of therapy is if NSVT persists after correction of electrolytes.      Intervention Category Major Interventions: Arrhythmia - evaluation and management  Aili Casillas Eugene 07/22/2014, 3:50 PM

## 2014-07-22 NOTE — Progress Notes (Signed)
eLink Physician-Brief Progress Note Patient Name: Danny [name] DOB: 11-Mar-1921 MRN: [id number]   Date of Service  07/22/2014  HPI/Events of Note  Hyperglycemia, renal failure  eICU Interventions  Start low dose SSI     Intervention Category Intermediate Interventions: Hyperglycemia - evaluation and treatment  Danny [name], DOUGLAS 07/22/2014, 12:20 AM

## 2014-07-22 NOTE — Progress Notes (Signed)
eLink Physician-Brief Progress Note Patient Name: Danny [name] DOB: 09-Jul-1920 MRN: [id number]   Date of Service  07/22/2014  HPI/Events of Note  Marked agitation  eICU Interventions  Increase top end precedex dose     Intervention Category Major Interventions: Delirium, psychosis, severe agitation - evaluation and management  [name] 07/22/2014, 4:47 AM

## 2014-07-22 NOTE — Progress Notes (Signed)
PULMONARY / CRITICAL CARE MEDICINE   Name: Danny [name] MRN: [id number] DOB: 11-Nov-1920    ADMISSION DATE:  08/07/2014 CONSULTATION DATE:  07/19/2014  REFERRING MD :  Waymon AmatoHongalgi  CHIEF COMPLAINT:  Confusion  INITIAL PRESENTATION:  81 yo male smoker with weakness, hypothermia, hypoglycemia, AKI, metabolic acidosis with respiratory alkalosis, and possible sepsis.  STUDIES:  5/07 CT head >> chronic cerebellum and Lt thalamus infarct 5/07 Echo >> EF 20-25%, moD AS/AI, RVSP 65 5/07 Renal u/s >> Markedly thickened gallbladder wall to 14 mm thick with edema/striations within wall without gallstones ; findings are concerning for acalculus cholecystitis  SIGNIFICANT EVENTS: 5/07 Admit, neuro consulted   HISTORY OF PRESENT ILLNESS:   81 yo male was feeling weak.  His family noted he had slurred speech and Rt facial droop.  He was seen in ER with concern for CVA.  He was noted to be hypothermic and blood sugar was 17.  He also had AKI, metabolic acidosis with elevated lactic acid, and hypotension.  He was seen by stroke service.  He was started on Abx, and given dextrose.  He was started on warming blanket.  He was admitted to SDU.  He developed agitation and received several doses of ativan, morphine, and haldol.   SUBJECTIVE: remains Agitated requiring restraints On higher dose  precedex gtt with breakthrough On [place]   VITAL SIGNS: Temp:  [96.1 F (35.6 C)-98.9 F (37.2 C)] 96.1 F (35.6 C) (05/11 0800) Pulse Rate:  [34-171] 49 (05/11 1000) Resp:  [12-29] 19 (05/11 1100) BP: (91-158)/(41-86) 91/64 mmHg (05/11 1100) SpO2:  [90 %-100 %] 100 % (05/11 1000) Weight:  [133 lb 6.1 oz (60.5 kg)] 133 lb 6.1 oz (60.5 kg) (05/11 0500) INTAKE / OUTPUT:  Intake/Output Summary (Last 24 hours) at 07/22/14 1146 Last data filed at 07/22/14 1100  Gross per 24 hour  Intake 1598.53 ml  Output    695 ml  Net 903.53 ml    PHYSICAL EXAMINATION: General: ill appearing, cachetic Neuro:   RASS-2 to  +2, mumbles and moves extremities , int follows 1 step commands HEENT: dry oral mucosa Cardiovascular:  Irregular, tachycardic Lungs:  Decreased breath sounds, basilar crackles Abdomen:  Soft, mild epigastric tenderness, no rebound/guarding Musculoskeletal:  Decreased muscle bulk Skin:  No rashes  LABS:  CBC  Recent Labs Lab 07/20/14 0530 07/21/14 0250 07/22/14 0550  WBC 6.7 8.7 13.0*  HGB 10.8* 12.4* 12.9*  HCT 33.1* 38.6* 40.0  PLT 80* 72* 53*   Coag's  Recent Labs Lab 07/17/2014 1639 07/20/2014 2301 07/19/14 1031 07/20/14 0530 07/21/14 0250  APTT 32 43* 49*  --   --   INR 2.85* 3.51* 3.15* 2.09* 1.87*   BMET  Recent Labs Lab 07/20/14 0530 07/21/14 0250 07/22/14 0550  NA 144 145 152*  K 4.0 3.6 3.4*  CL 113* 115* 119*  CO2 17* 21* 16*  BUN 79* 77* 83*  CREATININE 1.95* 2.15* 2.06*  GLUCOSE 177* 220* 200*   Electrolytes  Recent Labs Lab 07/20/14 0530 07/21/14 0250 07/22/14 0550  CALCIUM 8.8* 8.9 8.9   Sepsis Markers  Recent Labs Lab 07/25/2014 2301 07/15/2014 2318 07/19/14 0200 07/19/14 1031 07/20/14 0530 07/22/14 0550  LATICACIDVEN  --  11.1* 7.7* 2.7*  --   --   PROCALCITON 2.53  --   --   --  5.74 2.90   ABG  Recent Labs Lab 07/19/14 0913 07/19/14 1558 07/20/14 0420  PHART 7.377 7.353 7.392  PCO2ART 29.1* 31.8* 32.3*  PO2ART 113*  115* 86.9   Liver Enzymes  Recent Labs Lab 07/19/14 1713 07/20/14 0530 07/21/14 0250  AST 276* 161* 142*  ALT 110* 92* 104*  ALKPHOS 60 59 56  BILITOT 2.6* 2.4* 2.6*  ALBUMIN 2.8* 2.8* 2.5*   Cardiac Enzymes  Recent Labs Lab 07/20/2014 2301 07/19/14 0430 07/19/14 1030  TROPONINI 0.44* 0.53* 0.63*   Glucose  Recent Labs Lab 07/21/14 0013 07/21/14 0659 07/21/14 1218 07/21/14 2318 07/22/14 0345 07/22/14 0812  GLUCAP 209* 171* 193* 243* 192* 161*    Imaging No results found.   ASSESSMENT / PLAN:  PULMONARY A: AECOPD. P:   Change to brovana, pulmicort with prn  albuterol Oxygen to keep SpO2 > 92% Dc Solumedrol   CARDIOVASCULAR A:  Sepsis. Hx of CAD, chronic systolic CHF (EF 25 to 30% from 05/11/13). PAF. P: Lasix if breathing worse   RENAL A:   AKI >> baseline creatinine 1.29 from 02/25/14-FENa< 1 s/o pre renal Anion gap metabolic acidosis -resolving. Lactic acidosis -resolved Osmolar gap >> measured osmolarity 325, calculated 300- alcohols neg. Hypernatremia /hypokalemia P:   d5W @ 75/h Replete lytes   GASTROINTESTINAL A:   Abdominal pain. Elevated lFTs -improving, concern for acalculous cholecystitis  Lipase nml P:   NPO - place NGT & start Tfs Since improving, does not need GB drain   HEMATOLOGIC A:    thrombocytopenia. Coagulopathy -improving with FFP. P:  SCD's Dc heparin, send HIT panel  INFECTIOUS A:   Concern for sepsis ?source - acalculous chole P:   5/8 vancomycin >> 5/11 5/8 zosyn  Blood 5/07 >> ng  Follow pct to decide duration  ENDOCRINE A:   Hypoglycemia- TSH, cortisol ok P:   monitor   NEUROLOGIC A:   Acute encephalopathy with concern for CVA - prob metabolic P:   Resume ASA  Try to minimize precedex -use haldol prn   Summary - Hepato renal failure of unclear etiology ? Sepsis, with dropping plts &s evere delerium  The patient is critically ill with multiple organ systems failure and requires high complexity decision making for assessment and support, frequent evaluation and titration of therapies, application of advanced monitoring technologies and extensive interpretation of multiple databases. Critical Care Time devoted to patient care services described in this note independent of APP time is 32 minutes.    Cyril Mourningakesh Alva MD. Tonny BollmanFCCP. [facility] Pager [phone] If no response call [phone]    07/22/2014, 11:46 AM

## 2014-07-23 LAB — CBC
HCT: 36 % — ABNORMAL LOW (ref 39.0–52.0)
Hemoglobin: 11.8 g/dL — ABNORMAL LOW (ref 13.0–17.0)
MCH: 30.3 pg (ref 26.0–34.0)
MCHC: 32.8 g/dL (ref 30.0–36.0)
MCV: 92.5 fL (ref 78.0–100.0)
Platelets: 42 10*3/uL — ABNORMAL LOW (ref 150–400)
RBC: 3.89 MIL/uL — ABNORMAL LOW (ref 4.22–5.81)
RDW: 16.5 % — ABNORMAL HIGH (ref 11.5–15.5)
WBC: 12.9 10*3/uL — AB (ref 4.0–10.5)

## 2014-07-23 LAB — GLUCOSE, CAPILLARY
GLUCOSE-CAPILLARY: 119 mg/dL — AB (ref 65–99)
GLUCOSE-CAPILLARY: 166 mg/dL — AB (ref 65–99)
Glucose-Capillary: 130 mg/dL — ABNORMAL HIGH (ref 65–99)
Glucose-Capillary: 155 mg/dL — ABNORMAL HIGH (ref 65–99)
Glucose-Capillary: 164 mg/dL — ABNORMAL HIGH (ref 65–99)
Glucose-Capillary: 194 mg/dL — ABNORMAL HIGH (ref 65–99)

## 2014-07-23 LAB — COMPREHENSIVE METABOLIC PANEL
ALT: 78 U/L — ABNORMAL HIGH (ref 17–63)
AST: 98 U/L — ABNORMAL HIGH (ref 15–41)
Albumin: 2.3 g/dL — ABNORMAL LOW (ref 3.5–5.0)
Alkaline Phosphatase: 51 U/L (ref 38–126)
Anion gap: 10 (ref 5–15)
BUN: 97 mg/dL — ABNORMAL HIGH (ref 6–20)
CHLORIDE: 120 mmol/L — AB (ref 101–111)
CO2: 20 mmol/L — AB (ref 22–32)
CREATININE: 2.5 mg/dL — AB (ref 0.61–1.24)
Calcium: 8.8 mg/dL — ABNORMAL LOW (ref 8.9–10.3)
GFR calc Af Amer: 24 mL/min — ABNORMAL LOW (ref >60)
GFR calc non Af Amer: 21 mL/min — ABNORMAL LOW (ref >60)
Glucose, Bld: 147 mg/dL — ABNORMAL HIGH (ref 65–99)
Potassium: 3.8 mmol/L (ref 3.5–5.1)
Sodium: 150 mmol/L — ABNORMAL HIGH (ref 135–145)
Total Bilirubin: 2.4 mg/dL — ABNORMAL HIGH (ref 0.3–1.2)
Total Protein: 5.7 g/dL — ABNORMAL LOW (ref 6.5–8.1)

## 2014-07-23 MED ORDER — SODIUM CHLORIDE 0.45 % IV SOLN
INTRAVENOUS | Status: DC
  Administered 2014-07-23 – 2014-07-24 (×2): via INTRAVENOUS

## 2014-07-23 MED ORDER — SODIUM CHLORIDE 0.9 % IV SOLN
INTRAVENOUS | Status: DC
  Administered 2014-07-23: 11:00:00 via INTRAVENOUS

## 2014-07-23 MED ORDER — RISPERIDONE 1 MG/ML PO SOLN
0.2500 mg | Freq: Two times a day (BID) | ORAL | Status: DC
  Administered 2014-07-23 (×2): 0.25 mg
  Filled 2014-07-23 (×4): qty 0.25

## 2014-07-23 MED ORDER — FREE WATER
200.0000 mL | Freq: Four times a day (QID) | Status: DC
  Administered 2014-07-23 – 2014-07-25 (×8): 200 mL

## 2014-07-23 NOTE — Progress Notes (Signed)
PT Cancellation Note  Patient Details Name: Danny [name] MRN: [id number] DOB: Oct 06, 1920   Cancelled Treatment:    Reason Eval/Treat Not Completed: Lethargy (despite precedex being discontinued earlier today) limiting ability to participate  Noted results of family meeting included hope for pt to be able to return home. Will follow and proceed with PT eval when medically appropriate.   Avah Bashor 07/23/2014, 3:03 PM  Pager [phone]

## 2014-07-23 NOTE — Progress Notes (Signed)
PULMONARY / CRITICAL CARE MEDICINE   Name: Danny [name] MRN: [id number] DOB: Dec 04, 1920    ADMISSION DATE:  07/17/2014 CONSULTATION DATE:  07/19/2014  REFERRING MD :  Waymon AmatoHongalgi  CHIEF COMPLAINT:  Confusion  INITIAL PRESENTATION:  81 yo male smoker with weakness, hypothermia, hypoglycemia, AKI, metabolic acidosis with respiratory alkalosis, and possible sepsis.  STUDIES:  5/07 CT head >> chronic cerebellum and Lt thalamus infarct 5/07 Echo >> EF 20-25%, moD AS/AI, RVSP 65 5/07 Renal u/s >> Markedly thickened gallbladder wall to 14 mm thick with edema/striations within wall without gallstones ; findings are concerning for acalculus cholecystitis 5/11 NSVT  SIGNIFICANT EVENTS: 5/07 Admit, neuro consulted   HISTORY OF PRESENT ILLNESS:   81 yo male was feeling weak.  His family noted he had slurred speech and Rt facial droop.  He was seen in ER with concern for CVA.  He was noted to be hypothermic and blood sugar was 17.  He also had AKI, metabolic acidosis with elevated lactic acid, and hypotension.  He was seen by stroke service.  He was started on Abx, and given dextrose.  He was started on warming blanket.  He was admitted to SDU.  He developed agitation and received several doses of ativan, morphine, and haldol.   SUBJECTIVE: remains Agitated requiring restraints On high dose  precedex gtt with breakthrough On [place] NSVT overnight   VITAL SIGNS: Temp:  [96 F (35.6 C)-99.7 F (37.6 C)] 99.7 F (37.6 C) (05/12 0700) Pulse Rate:  [25-90] 82 (05/12 0900) Resp:  [8-30] 21 (05/12 1000) BP: (85-119)/(41-90) 109/61 mmHg (05/12 1000) SpO2:  [75 %-100 %] 100 % (05/12 1000) FiO2 (%):  [55 %] 55 % (05/12 0819) Weight:  [139 lb 5.3 oz (63.2 kg)] 139 lb 5.3 oz (63.2 kg) (05/12 0451) INTAKE / OUTPUT:  Intake/Output Summary (Last 24 hours) at 07/23/14 1143 Last data filed at 07/23/14 1100  Gross per 24 hour  Intake 2792.15 ml  Output    665 ml  Net 2127.15 ml    PHYSICAL  EXAMINATION: General: ill appearing, cachetic Neuro:   RASS-2 to +2, mumbles and moves extremities , int follows 1 step commands HEENT: dry oral mucosa Cardiovascular:  Irregular, tachycardic Lungs:  Decreased breath sounds, basilar crackles Abdomen:  Soft, mild epigastric tenderness, no rebound/guarding Musculoskeletal:  Decreased muscle bulk Skin:  No rashes  LABS:  CBC  Recent Labs Lab 07/21/14 0250 07/22/14 0550 07/23/14 0210  WBC 8.7 13.0* 12.9*  HGB 12.4* 12.9* 11.8*  HCT 38.6* 40.0 36.0*  PLT 72* 53* 42*   Coag's  Recent Labs Lab 07/16/2014 1639 07/16/2014 2301 07/19/14 1031 07/20/14 0530 07/21/14 0250  APTT 32 43* 49*  --   --   INR 2.85* 3.51* 3.15* 2.09* 1.87*   BMET  Recent Labs Lab 07/22/14 0550 07/22/14 1833 07/23/14 0210  NA 152* 149* 150*  K 3.4* 4.0 3.8  CL 119* 117* 120*  CO2 16* 21* 20*  BUN 83* 93* 97*  CREATININE 2.06* 2.29* 2.50*  GLUCOSE 200* 145* 147*   Electrolytes  Recent Labs Lab 07/22/14 0550 07/22/14 1833 07/23/14 0210  CALCIUM 8.9 8.8* 8.8*  MG  --  2.5*  --    Sepsis Markers  Recent Labs Lab 07/30/2014 2301 07/23/2014 2318 07/19/14 0200 07/19/14 1031 07/20/14 0530 07/22/14 0550  LATICACIDVEN  --  11.1* 7.7* 2.7*  --   --   PROCALCITON 2.53  --   --   --  5.74 2.90   ABG  Recent Labs Lab 07/19/14 0913 07/19/14 1558 07/20/14 0420  PHART 7.377 7.353 7.392  PCO2ART 29.1* 31.8* 32.3*  PO2ART 113* 115* 86.9   Liver Enzymes  Recent Labs Lab 07/20/14 0530 07/21/14 0250 07/23/14 0210  AST 161* 142* 98*  ALT 92* 104* 78*  ALKPHOS 59 56 51  BILITOT 2.4* 2.6* 2.4*  ALBUMIN 2.8* 2.5* 2.3*   Cardiac Enzymes  Recent Labs Lab 2015/02/08 2301 07/19/14 0430 07/19/14 1030  TROPONINI 0.44* 0.53* 0.63*   Glucose  Recent Labs Lab 07/22/14 1156 07/22/14 1626 07/22/14 1915 07/22/14 2334 07/23/14 0337 07/23/14 0805  GLUCAP 148* 139* 147* 156* 164* 155*    Imaging Dg Chest Port 1 View  07/22/2014    CLINICAL DATA:  81 year old male with respiratory failure, shortness of breath, elevated troponin. Initial encounter.  EXAM: PORTABLE CHEST - 1 VIEW  COMPARISON:  07/20/2014 and earlier.  FINDINGS: Portable AP semi upright view at 0644 hrs. Severe cardiomegaly. Stable mediastinal contours. Dense retrocardiac opacity with bilateral pulmonary veiling opacity. No pneumothorax. Stable ventilation since 07/20/2014.  IMPRESSION: Cardiomegaly with bilateral pleural effusions and lower lobe collapse or consolidation, stable since 07/20/2014.   Electronically Signed   By: Odessa FlemingH  Hall M.D.   On: 07/22/2014 08:05   Dg Abd Portable 1v  07/22/2014   CLINICAL DATA:  Check feeding tube placement  EXAM: PORTABLE ABDOMEN - 1 VIEW  COMPARISON:  None.  FINDINGS: Feeding catheter is noted within the stomach coiled in the fundus. Scattered large and small bowel gas is noted. Degenerative changes of the lumbar spine are seen.  IMPRESSION: Feeding tube within the stomach.   Electronically Signed   By: Alcide CleverMark  Lukens M.D.   On: 07/22/2014 10:38     ASSESSMENT / PLAN:  PULMONARY A: AECOPD. P:   Change to brovana, pulmicort with prn albuterol Oxygen to keep SpO2 > 92% Dc Solumedrol   CARDIOVASCULAR A:  Sepsis. Hx of CAD, chronic systolic CHF (EF 25 to 30% from 05/11/13). PAF. P: Lasix if breathing worse   RENAL A:   AKI >> baseline creatinine 1.29 from 02/25/14-FENa< 1 s/o pre renal Anion gap metabolic acidosis -resolving. Lactic acidosis -resolved Osmolar gap >> measured osmolarity 325, calculated 300- alcohols neg. Hypernatremia /hypokalemia P:   1/2 NS @ 50/h Replete lytes   GASTROINTESTINAL A:   Abdominal pain. Elevated lFTs -improving, concern for acalculous cholecystitis  Lipase nml P:   NPO - ct Tfs Since improving, does not need GB drain   HEMATOLOGIC A:    thrombocytopenia. Coagulopathy -improving with FFP. P:  SCD's Dc heparin, send HIT panel  INFECTIOUS A:   Concern for sepsis  ?source - acalculous chole P:   5/8 vancomycin >> 5/11 5/8 zosyn  Blood 5/07 >> ng  Follow pct to decide duration  ENDOCRINE A:   Hypoglycemia- TSH, cortisol ok P:   monitor   NEUROLOGIC A:   Acute encephalopathy with concern for CVA - prob metabolic Underlying dementia P:   Resume ASA  Try to minimize precedex -add risperdal- qTC limiting  Family - updated children 5/12, DNR issued, they are hopeful of his returning home  Summary - Hepato renal failure of unclear etiology ? Sepsis, with dropping plts &s evere delerium  The patient is critically ill with multiple organ systems failure and requires high complexity decision making for assessment and support, frequent evaluation and titration of therapies, application of advanced monitoring technologies and extensive interpretation of multiple databases. Critical Care Time devoted to patient care services described in  this note independent of APP time is 32 minutes.    Cyril Mourning MD. Tonny Bollman. [facility] Pager [phone] If no response call [phone]    07/23/2014, 11:43 AM

## 2014-07-24 ENCOUNTER — Telehealth: Payer: Self-pay | Admitting: Endocrinology

## 2014-07-24 LAB — CBC
HCT: 34.4 % — ABNORMAL LOW (ref 39.0–52.0)
HEMOGLOBIN: 11 g/dL — AB (ref 13.0–17.0)
MCH: 29.3 pg (ref 26.0–34.0)
MCHC: 32 g/dL (ref 30.0–36.0)
MCV: 91.5 fL (ref 78.0–100.0)
Platelets: 42 10*3/uL — ABNORMAL LOW (ref 150–400)
RBC: 3.76 MIL/uL — ABNORMAL LOW (ref 4.22–5.81)
RDW: 16.7 % — ABNORMAL HIGH (ref 11.5–15.5)
WBC: 13.1 10*3/uL — ABNORMAL HIGH (ref 4.0–10.5)

## 2014-07-24 LAB — COMPREHENSIVE METABOLIC PANEL
ALBUMIN: 2 g/dL — AB (ref 3.5–5.0)
ALK PHOS: 73 U/L (ref 38–126)
ALT: 60 U/L (ref 17–63)
ANION GAP: 9 (ref 5–15)
AST: 57 U/L — ABNORMAL HIGH (ref 15–41)
BUN: 89 mg/dL — AB (ref 6–20)
CHLORIDE: 118 mmol/L — AB (ref 101–111)
CO2: 21 mmol/L — AB (ref 22–32)
CREATININE: 2.08 mg/dL — AB (ref 0.61–1.24)
Calcium: 8.3 mg/dL — ABNORMAL LOW (ref 8.9–10.3)
GFR calc Af Amer: 30 mL/min — ABNORMAL LOW (ref >60)
GFR calc non Af Amer: 26 mL/min — ABNORMAL LOW (ref >60)
Glucose, Bld: 248 mg/dL — ABNORMAL HIGH (ref 65–99)
Potassium: 2.9 mmol/L — ABNORMAL LOW (ref 3.5–5.1)
Sodium: 148 mmol/L — ABNORMAL HIGH (ref 135–145)
TOTAL PROTEIN: 5.2 g/dL — AB (ref 6.5–8.1)
Total Bilirubin: 1.9 mg/dL — ABNORMAL HIGH (ref 0.3–1.2)

## 2014-07-24 LAB — GLUCOSE, CAPILLARY
GLUCOSE-CAPILLARY: 201 mg/dL — AB (ref 65–99)
GLUCOSE-CAPILLARY: 206 mg/dL — AB (ref 65–99)
GLUCOSE-CAPILLARY: 211 mg/dL — AB (ref 65–99)
Glucose-Capillary: 203 mg/dL — ABNORMAL HIGH (ref 65–99)
Glucose-Capillary: 223 mg/dL — ABNORMAL HIGH (ref 65–99)
Glucose-Capillary: 192 mg/dL — ABNORMAL HIGH (ref 65–99)

## 2014-07-24 LAB — CULTURE, BLOOD (ROUTINE X 2)
Culture: NO GROWTH
Culture: NO GROWTH

## 2014-07-24 MED ORDER — RISPERIDONE 1 MG/ML PO SOLN
0.5000 mg | Freq: Two times a day (BID) | ORAL | Status: DC
  Administered 2014-07-24 – 2014-07-26 (×6): 0.5 mg
  Filled 2014-07-24 (×8): qty 0.5

## 2014-07-24 MED ORDER — FUROSEMIDE 10 MG/ML IJ SOLN
40.0000 mg | Freq: Once | INTRAMUSCULAR | Status: AC
  Administered 2014-07-24: 40 mg via INTRAVENOUS
  Filled 2014-07-24: qty 4

## 2014-07-24 MED ORDER — DONEPEZIL HCL 10 MG PO TABS
10.0000 mg | ORAL_TABLET | Freq: Every day | ORAL | Status: DC
  Administered 2014-07-24 – 2014-07-30 (×7): 10 mg via ORAL
  Filled 2014-07-24 (×8): qty 1

## 2014-07-24 MED ORDER — POTASSIUM CHLORIDE CRYS ER 20 MEQ PO TBCR
40.0000 meq | EXTENDED_RELEASE_TABLET | ORAL | Status: AC
  Administered 2014-07-24 (×2): 40 meq via ORAL
  Filled 2014-07-24 (×2): qty 2

## 2014-07-24 MED ORDER — POTASSIUM CHLORIDE 20 MEQ PO PACK
40.0000 meq | PACK | ORAL | Status: DC
  Filled 2014-07-24 (×2): qty 2

## 2014-07-24 NOTE — Progress Notes (Signed)
eLink Physician-Brief Progress Note Patient Name: Danny [name] DOB: 1921-03-10 MRN: [id number]   Date of Service  07/24/2014  HPI/Events of Note   Recent Labs Lab 07/21/14 0250 07/22/14 0550 07/22/14 1833 07/23/14 0210 07/24/14 1001  NA 145 152* 149* 150* 148*  K 3.6 3.4* 4.0 3.8 2.9*  CL 115* 119* 117* 120* 118*  CO2 21* 16* 21* 20* 21*  GLUCOSE 220* 200* 145* 147* 248*  BUN 77* 83* 93* 97* 89*  CREATININE 2.15* 2.06* 2.29* 2.50* 2.08*  CALCIUM 8.9 8.9 8.8* 8.8* 8.3*  MG  --   --  2.5*  --   --      eICU Interventions  K replaced     Intervention Category Intermediate Interventions: Electrolyte abnormality - evaluation and management  Lanayah Gartley S. 07/24/2014, 5:15 PM

## 2014-07-24 NOTE — Progress Notes (Signed)
eLink Physician-Brief Progress Note Patient Name: Danny [name] DOB: 10-Jun-1920 MRN: [id number]   Date of Service  07/24/2014  HPI/Events of Note  Patient started on TFs, now with diarrhea.   eICU Interventions  Plan: Check C Diff Insert rectal tube if C Diff negative     Intervention Category Minor Interventions: Routine modifications to care plan (e.g. PRN medications for pain, fever)  DETERDING,ELIZABETH 07/24/2014, 10:26 PM

## 2014-07-24 NOTE — Telephone Encounter (Signed)
[facility]

## 2014-07-24 NOTE — Progress Notes (Signed)
PULMONARY / CRITICAL CARE MEDICINE   Name: Danny [name] MRN: [id number] DOB: 01-25-1921    ADMISSION DATE:  07/25/2014 CONSULTATION DATE:  07/19/2014  REFERRING MD :  Waymon AmatoHongalgi  CHIEF COMPLAINT:  Confusion  INITIAL PRESENTATION:  81 yo male smoker with weakness, hypothermia, hypoglycemia, AKI, metabolic acidosis with respiratory alkalosis, and possible sepsis.  STUDIES:  5/07 CT head >> chronic cerebellum and Lt thalamus infarct 5/07 Echo >> EF 20-25%, moD AS/AI, RVSP 65 5/07 Renal u/s >> Markedly thickened gallbladder wall to 14 mm thick with edema/striations within wall without gallstones ; findings are concerning for acalculus cholecystitis 5/11 NSVT  SIGNIFICANT EVENTS: 5/07 Admit, neuro consulted   HISTORY OF PRESENT ILLNESS:   81 yo male was feeling weak.  His family noted he had slurred speech and Rt facial droop.  He was seen in ER with concern for CVA.  He was noted to be hypothermic and blood sugar was 17.  He also had AKI, metabolic acidosis with elevated lactic acid, and hypotension.  He was seen by stroke service.  He was started on Abx, and given dextrose.  He was started on warming blanket.  He was admitted to SDU.  He developed agitation and received several doses of ativan, morphine, and haldol.   SUBJECTIVE: remains Agitated requiring restraints On high dose  precedex gtt with breakthrough On [place]    VITAL SIGNS: Temp:  [97.4 F (36.3 C)-98.9 F (37.2 C)] 97.4 F (36.3 C) (05/13 0736) Pulse Rate:  [38-130] 38 (05/13 0600) Resp:  [16-32] 17 (05/13 0600) BP: (87-145)/(45-78) 144/58 mmHg (05/13 0600) SpO2:  [86 %-100 %] 92 % (05/13 0807) Weight:  [142 lb 10.2 oz (64.7 kg)] 142 lb 10.2 oz (64.7 kg) (05/13 0600) INTAKE / OUTPUT:  Intake/Output Summary (Last 24 hours) at 07/24/14 0830 Last data filed at 07/24/14 0700  Gross per 24 hour  Intake 3452.8 ml  Output    795 ml  Net 2657.8 ml    PHYSICAL EXAMINATION: General: ill appearing, cachetic Neuro:    RASS-2 to +2, mumbles and moves extremities , int follows 1 step commands HEENT: dry oral mucosa Cardiovascular:  Irregular, tachycardic Lungs:  Decreased breath sounds, basilar crackles Abdomen:  Soft, mild epigastric tenderness, no rebound/guarding Musculoskeletal:  Decreased muscle bulk Skin:  No rashes  LABS:  CBC  Recent Labs Lab 07/21/14 0250 07/22/14 0550 07/23/14 0210  WBC 8.7 13.0* 12.9*  HGB 12.4* 12.9* 11.8*  HCT 38.6* 40.0 36.0*  PLT 72* 53* 42*   Coag's  Recent Labs Lab 2015/02/13 1639 2015/02/13 2301 07/19/14 1031 07/20/14 0530 07/21/14 0250  APTT 32 43* 49*  --   --   INR 2.85* 3.51* 3.15* 2.09* 1.87*   BMET  Recent Labs Lab 07/22/14 0550 07/22/14 1833 07/23/14 0210  NA 152* 149* 150*  K 3.4* 4.0 3.8  CL 119* 117* 120*  CO2 16* 21* 20*  BUN 83* 93* 97*  CREATININE 2.06* 2.29* 2.50*  GLUCOSE 200* 145* 147*   Electrolytes  Recent Labs Lab 07/22/14 0550 07/22/14 1833 07/23/14 0210  CALCIUM 8.9 8.8* 8.8*  MG  --  2.5*  --    Sepsis Markers  Recent Labs Lab 2015/02/13 2301 2015/02/13 2318 07/19/14 0200 07/19/14 1031 07/20/14 0530 07/22/14 0550  LATICACIDVEN  --  11.1* 7.7* 2.7*  --   --   PROCALCITON 2.53  --   --   --  5.74 2.90   ABG  Recent Labs Lab 07/19/14 0913 07/19/14 1558 07/20/14 0420  PHART 7.377 7.353 7.392  PCO2ART 29.1* 31.8* 32.3*  PO2ART 113* 115* 86.9   Liver Enzymes  Recent Labs Lab 07/20/14 0530 07/21/14 0250 07/23/14 0210  AST 161* 142* 98*  ALT 92* 104* 78*  ALKPHOS 59 56 51  BILITOT 2.4* 2.6* 2.4*  ALBUMIN 2.8* 2.5* 2.3*   Cardiac Enzymes  Recent Labs Lab 08/03/2014 2301 07/19/14 0430 07/19/14 1030  TROPONINI 0.44* 0.53* 0.63*   Glucose  Recent Labs Lab 07/23/14 0805 07/23/14 1219 07/23/14 1557 07/23/14 1919 07/23/14 2347 07/24/14 0350  GLUCAP 155* 119* 130* 166* 194* 203*    Imaging No results found.   ASSESSMENT / PLAN:  PULMONARY A: AECOPD. Pleural effusions P:    Change to brovana, pulmicort with prn albuterol Oxygen to keep SpO2 > 92% Dc Solumedrol   CARDIOVASCULAR A:  Sepsis. Hx of CAD, chronic systolic CHF (EF 25 to 30% from 05/11/13). PAF. P: Lasix  40 x 1   RENAL A:   AKI >> baseline creatinine 1.29 from 02/25/14-FENa< 1 s/o pre renal Anion gap metabolic acidosis -resolving. Lactic acidosis -resolved Osmolar gap >> measured osmolarity 325, calculated 300- alcohols neg. Hypernatremia /hypokalemia P:   Dc IVFs Replete lytes Free water via tube   GASTROINTESTINAL A:   Abdominal pain. Elevated lFTs -improving, concern for acalculous cholecystitis  Lipase nml P:   NPO - ct Tfs -does not need GB drain   HEMATOLOGIC A:    thrombocytopenia. Coagulopathy -improving with FFP. P:  SCD's Dc heparin,  HIT panel not sent since never received heparin this admit  INFECTIOUS A:   Concern for sepsis ?source - acalculous chole P:   5/8 vancomycin >> 5/11 5/8 zosyn >>  Blood 5/07 >> ng  Plan 7 days  ENDOCRINE A:   Hypoglycemia- TSH, cortisol ok P:   monitor   NEUROLOGIC A:   Acute encephalopathy with concern for CVA - prob metabolic Underlying dementia P:   Resume ASA  Try to minimize precedex -titrate risperdal- qTC limiting  Family - updated children 5/12, DNR issued, they are hopeful of his returning home  Summary - Hepato renal failure of unclear etiology ? Sepsis, with dropping plts & severe delerium, will involve palliative care to discuss goals of care given severe eprsistent delerium  The patient is critically ill with multiple organ systems failure and requires high complexity decision making for assessment and support, frequent evaluation and titration of therapies, application of advanced monitoring technologies and extensive interpretation of multiple databases. Critical Care Time devoted to patient care services described in this note independent of APP time is 32 minutes.    Cyril Mourningakesh Diem Dicocco MD.  Tonny BollmanFCCP. [facility] Pager [phone] If no response call [phone]    07/24/2014, 8:30 AM

## 2014-07-24 NOTE — Progress Notes (Addendum)
Inpatient Diabetes Program Recommendations  AACE/ADA: New Consensus Statement on Inpatient Glycemic Control (2013)  Target Ranges:  Prepandial:   less than 140 mg/dL      Peak postprandial:   less than 180 mg/dL (1-2 hours)      Critically ill patients:  140 - 180 mg/dL   Results for Danny [name], Danny [name] (MRN [id number]) as of 07/24/2014 14:19  Ref. Range 07/24/2014 03:50 07/24/2014 07:35 07/24/2014 12:21  Glucose-Capillary Latest Ref Range: 65-99 mg/dL 409203 (H) 811206 (H) 914223 (H)   Diabetes history: DM 2 Current orders for Inpatient glycemic control: Novolog 0-9 units Q4hrs  Inpatient Diabetes Program Recommendations Insulin - Tube Feed Coverage: Patient's glucose has increased into the 200's consistently. Tube feeds also increased from 35cc/hr to 45 cc/hr. Please consider Novolog 3 units Q4hrs for Tube Feed Coverage.   Thanks,  Christena DeemShannon Kalista Laguardia RN, MSN, [facility] Inpatient Diabetes Coordinator Team Pager [phone]

## 2014-07-24 NOTE — Progress Notes (Signed)
ANTIBIOTIC CONSULT NOTE   Pharmacy Consult for Zosyn Indication: rule out sepsis  No Known Allergies  Patient Measurements: Height: 5\' 8"  (172.7 cm) Weight: 142 lb 10.2 oz (64.7 kg) IBW/kg (Calculated) : 68.4  Vital Signs: Temp: 97.4 F (36.3 C) (05/13 0736) Temp Source: Oral (05/13 0736) BP: 126/58 mmHg (05/13 0900) Pulse Rate: 42 (05/13 0900)  Labs:  Recent Labs  07/22/14 0550 07/22/14 1833 07/23/14 0210  WBC 13.0*  --  12.9*  HGB 12.9*  --  11.8*  PLT 53*  --  42*  CREATININE 2.06* 2.29* 2.50*   Estimated Creatinine Clearance: 17.6 mL/min (by C-G formula based on Cr of 2.5).  Medical History: Past Medical History  Diagnosis Date  . DIABETES MELLITUS, TYPE II 12/06/2006  . NEPHROPATHY, DIABETIC 12/06/2006  . HYPERLIPIDEMIA 12/06/2006  . HYPOKALEMIA 01/12/2009  . TOBACCO USER 05/13/2009  . HYPERTENSION 12/06/2006  . CORONARY ATHEROSCLEROSIS, NATIVE VESSEL 05/13/2009  . Atrial fibrillation 05/04/2009  . COPD 12/06/2006  . GERD 12/06/2006  . DIVERTICULOSIS, COLON 12/06/2006  . CONSTIPATION 12/20/2007  . RECTAL BLEEDING 12/20/2007  . RENAL INSUFFICIENCY 12/20/2007  . BACK PAIN, LUMBAR 01/21/2008  . DIZZINESS OR VERTIGO 12/06/2006  . ABNORMAL EKG 05/13/2009  . Diastolic dysfunction   . Blindness of right eye     Medications:  Prescriptions prior to admission  Medication Sig Dispense Refill Last Dose  . albuterol (PROVENTIL HFA;VENTOLIN HFA) 108 (90 BASE) MCG/ACT inhaler Inhale 1-2 puffs into the lungs every 6 (six) hours as needed for wheezing or shortness of breath. 1 Inhaler 0 Past Week at Unknown time  . aspirin 81 MG tablet Take 81 mg by mouth every evening.    08/03/2014 at Unknown time  . dextromethorphan-guaiFENesin (MUCINEX DM) 30-600 MG per 12 hr tablet Take 1 tablet by mouth 2 (two) times daily as needed for cough.   08/08/2014 at Unknown time  . Fluticasone Furoate-Vilanterol (BREO ELLIPTA) 100-25 MCG/INH AEPB Inhale 1 puff into the lungs every morning. 60 each 0  07/28/2014 at Unknown time  . furosemide (LASIX) 20 MG tablet Take 20 mg by mouth daily.   08/10/2014 at Unknown time  . zolpidem (AMBIEN) 5 MG tablet Take 1 tablet (5 mg total) by mouth at bedtime as needed for sleep. (Patient not taking: Reported on 08/07/2014) 30 tablet 1 Not Taking at Unknown time   Scheduled:  . antiseptic oral rinse  7 mL Mouth Rinse q12n4p  . arformoterol  15 mcg Nebulization BID  . budesonide (PULMICORT) nebulizer solution  0.25 mg Nebulization BID  . chlorhexidine  15 mL Mouth Rinse BID  . donepezil  10 mg Oral QHS  . free water  200 mL Per Tube Q6H  . insulin aspart  0-9 Units Subcutaneous 6 times per day  . pantoprazole (PROTONIX) IV  40 mg Intravenous Q24H  . piperacillin-tazobactam (ZOSYN)  IV  2.25 g Intravenous 4 times per day  . risperiDONE  0.5 mg Per Tube BID   Infusions:  . dexmedetomidine 1 mcg/kg/hr (07/24/14 0900)  . feeding supplement (VITAL AF 1.2 CAL) 1,000 mL (07/24/14 0900)    Assessment: 81yo male presents initially as code stroke w/ facial droop and slurred speech after awakening from nap. He has been on zosyn empirically for possible abd source from acalculous chole. Renal function is about the same. Plan is for 7 days of abx. Today is D5 now. Cultures are ngtd.   Plan:   Cont zosyn 2.25g IV q6 F/u with stop date  Minh Pham,  PharmD Pager: [phone] 07/24/2014 10:48 AM

## 2014-07-24 NOTE — Telephone Encounter (Signed)
See note below to be advised. 

## 2014-07-24 NOTE — Progress Notes (Addendum)
Nutrition Follow-up  DOCUMENTATION CODES:  Non-severe (moderate) malnutrition in context of chronic illness  INTERVENTION:  Tube feeding   Continue Vital AF 1.2 at 55 ml/hr   NUTRITION DIAGNOSIS:  Inadequate oral intake related to inability to eat, lethargy/confusion as evidenced by NPO status, ongoing  GOAL:  Patient will meet greater than or equal to 90% of their needs, met  MONITOR:  TF tolerance, Labs, Weight trends, I & O's  ASSESSMENT: 81 yo male smoker with weakness, hypothermia, hypoglycemia, AKI, metabolic acidosis with respiratory alkalosis, and possible sepsis.  Vital AF 1.2 formula infusing at 55 ml/hr via Panda tube providing 1584 kcals, 99 gm protein, 1071 ml of free water.  Free water flushes at 200 ml every 6 hours.  Tolerating well.  Patient remains agitated, on Precedex drip.  Palliative Care Team consulted for goals of care.  Height:  Ht Readings from Last 1 Encounters:  07/21/2014 $RemoveB'5\' 8"'JNUHHEQC$  (1.727 m)    Weight:  Wt Readings from Last 1 Encounters:  07/24/14 142 lb 10.2 oz (64.7 kg)    Ideal Body Weight:  70 kg  Wt Readings from Last 10 Encounters:  07/24/14 142 lb 10.2 oz (64.7 kg)  03/31/14 133 lb (60.328 kg)  03/12/14 126 lb (57.153 kg)  02/25/14 128 lb (58.06 kg)  05/11/13 128 lb 11.2 oz (58.378 kg)  11/25/12 132 lb (59.875 kg)  01/11/12 134 lb (60.782 kg)  01/10/11 145 lb 8 oz (65.998 kg)  12/06/10 147 lb (66.679 kg)  05/16/10 148 lb (67.132 kg)    BMI:  Body mass index is 21.69 kg/(m^2).  Estimated Nutritional Needs:  Kcal:  1500-1700  Protein:  90-100 gm  Fluid:  1.5-1.7 L  Skin:  Reviewed, no issues  Diet Order:  Diet NPO time specified Except for: Sips with Meds  EDUCATION NEEDS:  No education needs identified at this time   Intake/Output Summary (Last 24 hours) at 07/24/14 1228 Last data filed at 07/24/14 1100  Gross per 24 hour  Intake 3027.85 ml  Output   1455 ml  Net 1572.85 ml    Last BM:  5/13  Arthur Holms, RD, LDN Pager #: [phone] After-Hours Pager #: [phone]

## 2014-07-24 NOTE — Evaluation (Signed)
Physical Therapy Evaluation Patient Details Name: Danny [name] MRN: [id number] DOB: [birth year] Today's Date: 07/24/2014   History of Present Illness  Adm 5/7 with AMS, sepsis; AKI; periods of extreme confusion/agitation alternating with sedation PMHx- COPD, CHF with EF 25%, DM, blind Rt eye (?prosthetic eye)  Clinical Impression  Pt admitted with above diagnosis. Pt currently with functional limitations due to the deficits listed below (see PT Problem List). Currently decr cognition impairs all mobility. Pt unable to hold himself upright in sitting or even attempt to bear weight on his legs. Pt will benefit from skilled PT to increase their independence and safety with mobility to allow discharge to the venue listed below.       Follow Up Recommendations SNF;Supervision/Assistance - 24 hour    Equipment Recommendations  Other (comment) (TBA)    Recommendations for Other Services OT consult     Precautions / Restrictions Precautions Precautions: Fall      Mobility  Bed Mobility Overal bed mobility: Needs Assistance Bed Mobility: Supine to Sit;Sit to Supine     Supine to sit: Max assist;HOB elevated Sit to supine: Max assist   General bed mobility comments: pt assisted minimally with moving legs over EOB, slightly with reaching to roll and to push up to sitting (with HOB 45); no assist to return to supine  Transfers                 General transfer comment: unable   Ambulation/Gait                Stairs            Wheelchair Mobility    Modified Rankin (Stroke Patients Only)       Balance Overall balance assessment: Needs assistance Sitting-balance support: Feet supported Sitting balance-Leahy Scale: Zero Sitting balance - Comments: allowed to lose his balance in all directions with NO righting reactions; held in forward lean and asked which way he was leaning or to sit himself up and pt unable to identify which way he was leaning                                     Pertinent Vitals/Pain VSS on ICU monitors  Pain Assessment: Faces Faces Pain Scale: No hurt    Home Living Family/patient expects to be discharged to:: Private residence Living Arrangements: Spouse/significant other               Additional Comments: per chart, family wants him to go home; pt confused and unable to get full history    Prior Function Level of Independence: Independent         Comments: per MD notes, highly functional PTA     Hand Dominance        Extremity/Trunk Assessment   Upper Extremity Assessment: Generalized weakness;Defer to OT evaluation           Lower Extremity Assessment: RLE deficits/detail;LLE deficits/detail RLE Deficits / Details: AAROM WFL; <3/5 hip flexion, 3+ hip/knee extension (in supine) LLE Deficits / Details: AAROM WFL; <3/5 hip flexion, 3+ hip/knee extension (in supine)  Cervical / Trunk Assessment: Other exceptions  Communication   Communication: Other (comment) (confused)  Cognition Arousal/Alertness: Lethargic Behavior During Therapy: Flat affect Overall Cognitive Status: Impaired/Different from baseline Area of Impairment: Orientation;Attention;Following commands;Awareness Orientation Level: Place;Time;Situation Current Attention Level: Sustained (barely above focused)   Following Commands: Follows one step commands inconsistently  Awareness:  (pre-intellectual)   General Comments: able to state month and day of his birthday; with multi-modal cues and incr time follows some 1 step commands    General Comments      Exercises        Assessment/Plan    PT Assessment Patient needs continued PT services  PT Diagnosis Generalized weakness;Altered mental status   PT Problem List Decreased strength;Decreased activity tolerance;Decreased balance;Decreased mobility;Decreased cognition;Decreased knowledge of use of DME;Decreased knowledge of precautions  PT Treatment  Interventions DME instruction;Gait training;Functional mobility training;Therapeutic activities;Therapeutic exercise;Balance training;Neuromuscular re-education;Cognitive remediation;Patient/family education   PT Goals (Current goals can be found in the Care Plan section) Acute Rehab PT Goals Patient Stated Goal: unable to state PT Goal Formulation: Patient unable to participate in goal setting Time For Goal Achievement: 08/07/14 Potential to Achieve Goals: Fair    Frequency Min 2X/week   Barriers to discharge        Co-evaluation               End of Session Equipment Utilized During Treatment: Oxygen Activity Tolerance: Patient limited by lethargy Patient left: in bed;with call bell/phone within reach;with restraints reapplied;with SCD's reapplied;with nursing/sitter in room Nurse Communication: Mobility status;Need for lift equipment         Time: 1215-1242 PT Time Calculation (min) (ACUTE ONLY): 27 min   Charges:   PT Evaluation $Initial PT Evaluation Tier I: 1 Procedure PT Treatments $Therapeutic Activity: 8-22 mins   PT G Codes:        Tucker Minter 07/24/2014, 1:03 PM Pager [phone]

## 2014-07-24 NOTE — Care Management Note (Signed)
Case Management Note  Patient Details  Name: Derinda SisJesse L Kutner MRN: [id number] Date of Birth: 12-25-1920  Subjective/Objective:                    Action/Plan:   Expected Discharge Date:                  Expected Discharge Plan:  Home w Home Health Services  In-House Referral:     Discharge planning Services     Post Acute Care Choice:    Choice offered to:     DME Arranged:    DME Agency:     HH Arranged:    HH Agency:     Status of Service:     Medicare Important Message Given:  Yes Date Medicare IM Given:  07/24/14 Medicare IM give by:  debbie Yasuo Phimmasone rn,bsn Date Additional Medicare IM Given:    Additional Medicare Important Message give by:     If discussed at Long Length of Stay Meetings, dates discussed:    Additional Comments:  Hanley HaysDowell, Smita Lesh T, RN 07/24/2014, 11:07 AM

## 2014-07-25 DIAGNOSIS — R6520 Severe sepsis without septic shock: Secondary | ICD-10-CM

## 2014-07-25 DIAGNOSIS — Z515 Encounter for palliative care: Secondary | ICD-10-CM

## 2014-07-25 DIAGNOSIS — A419 Sepsis, unspecified organism: Secondary | ICD-10-CM | POA: Diagnosis present

## 2014-07-25 LAB — COMPREHENSIVE METABOLIC PANEL
ALT: 53 U/L (ref 17–63)
ANION GAP: 10 (ref 5–15)
AST: 46 U/L — ABNORMAL HIGH (ref 15–41)
Albumin: 2 g/dL — ABNORMAL LOW (ref 3.5–5.0)
Alkaline Phosphatase: 77 U/L (ref 38–126)
BILIRUBIN TOTAL: 1.6 mg/dL — AB (ref 0.3–1.2)
BUN: 80 mg/dL — ABNORMAL HIGH (ref 6–20)
CO2: 21 mmol/L — ABNORMAL LOW (ref 22–32)
Calcium: 8.3 mg/dL — ABNORMAL LOW (ref 8.9–10.3)
Chloride: 115 mmol/L — ABNORMAL HIGH (ref 101–111)
Creatinine, Ser: 1.78 mg/dL — ABNORMAL HIGH (ref 0.61–1.24)
GFR, EST AFRICAN AMERICAN: 37 mL/min — AB (ref >60)
GFR, EST NON AFRICAN AMERICAN: 32 mL/min — AB (ref >60)
Glucose, Bld: 289 mg/dL — ABNORMAL HIGH (ref 65–99)
Potassium: 3.1 mmol/L — ABNORMAL LOW (ref 3.5–5.1)
SODIUM: 146 mmol/L — AB (ref 135–145)
Total Protein: 5.4 g/dL — ABNORMAL LOW (ref 6.5–8.1)

## 2014-07-25 LAB — CBC
HEMATOCRIT: 33 % — AB (ref 39.0–52.0)
Hemoglobin: 10.8 g/dL — ABNORMAL LOW (ref 13.0–17.0)
MCH: 29.8 pg (ref 26.0–34.0)
MCHC: 32.7 g/dL (ref 30.0–36.0)
MCV: 91.2 fL (ref 78.0–100.0)
Platelets: 46 10*3/uL — ABNORMAL LOW (ref 150–400)
RBC: 3.62 MIL/uL — ABNORMAL LOW (ref 4.22–5.81)
RDW: 16.8 % — AB (ref 11.5–15.5)
WBC: 12.5 10*3/uL — ABNORMAL HIGH (ref 4.0–10.5)

## 2014-07-25 LAB — GLUCOSE, CAPILLARY
GLUCOSE-CAPILLARY: 170 mg/dL — AB (ref 65–99)
GLUCOSE-CAPILLARY: 171 mg/dL — AB (ref 65–99)
Glucose-Capillary: 253 mg/dL — ABNORMAL HIGH (ref 65–99)
Glucose-Capillary: 196 mg/dL — ABNORMAL HIGH (ref 65–99)
Glucose-Capillary: 133 mg/dL — ABNORMAL HIGH (ref 65–99)

## 2014-07-25 LAB — CLOSTRIDIUM DIFFICILE BY PCR: CDIFFPCR: NEGATIVE

## 2014-07-25 MED ORDER — METOPROLOL TARTRATE 25 MG/10 ML ORAL SUSPENSION
12.5000 mg | Freq: Two times a day (BID) | ORAL | Status: DC
  Administered 2014-07-26 – 2014-07-31 (×10): 12.5 mg
  Filled 2014-07-25 (×15): qty 5

## 2014-07-25 MED ORDER — FREE WATER
250.0000 mL | Freq: Four times a day (QID) | Status: DC
  Administered 2014-07-25 – 2014-07-26 (×4): 250 mL

## 2014-07-25 MED ORDER — METOPROLOL TARTRATE 1 MG/ML IV SOLN
2.5000 mg | INTRAVENOUS | Status: DC | PRN
  Administered 2014-07-25: 2.5 mg via INTRAVENOUS
  Administered 2014-07-25 – 2014-07-26 (×3): 5 mg via INTRAVENOUS
  Administered 2014-07-30: 2.5 mg via INTRAVENOUS
  Filled 2014-07-25 (×4): qty 5

## 2014-07-25 MED ORDER — POTASSIUM CHLORIDE 20 MEQ/15ML (10%) PO SOLN
40.0000 meq | ORAL | Status: AC
  Administered 2014-07-25 (×2): 40 meq
  Filled 2014-07-25 (×2): qty 30

## 2014-07-25 MED ORDER — PIPERACILLIN-TAZOBACTAM IN DEX 2-0.25 GM/50ML IV SOLN
2.2500 g | Freq: Four times a day (QID) | INTRAVENOUS | Status: AC
  Administered 2014-07-25 (×2): 2.25 g via INTRAVENOUS
  Filled 2014-07-25 (×2): qty 50

## 2014-07-25 NOTE — Progress Notes (Signed)
PULMONARY / CRITICAL CARE MEDICINE   Name: Derinda SisJesse L Hammonds MRN: [id number] DOB: [birth year]    ADMISSION DATE:  08/10/2014 CONSULTATION DATE:  07/19/2014  REFERRING MD :  Waymon AmatoHongalgi  CHIEF COMPLAINT:  Confusion  INITIAL PRESENTATION:  81 yo male smoker with weakness, hypothermia, hypoglycemia, AKI, metabolic acidosis with respiratory alkalosis, and possible sepsis.  STUDIES:  5/07 CT head >> chronic cerebellum and Lt thalamus infarct 5/07 Echo >> EF 20-25%, moD AS/AI, RVSP 65 5/07 Renal u/s >> Markedly thickened gallbladder wall to 14 mm thick with edema/striations within wall without gallstones ; findings are concerning for acalculus cholecystitis 5/11 NSVT  SIGNIFICANT EVENTS: 5/07 Admit, neuro consulted   HISTORY OF PRESENT ILLNESS:   81 yo male was feeling weak.  His family noted he had slurred speech and Rt facial droop.  He was seen in ER with concern for CVA.  He was noted to be hypothermic and blood sugar was 17.  He also had AKI, metabolic acidosis with elevated lactic acid, and hypotension.  He was seen by stroke service.  He was started on Abx, and given dextrose.  He was started on warming blanket.  He was admitted to SDU.  He developed agitation and received several doses of ativan, morphine, and haldol.   SUBJECTIVE:  More oriented this am per RN precedex currently at 1   VITAL SIGNS: Temp:  [96.2 F (35.7 C)-98.1 F (36.7 C)] 98.1 F (36.7 C) (05/14 0730) Pulse Rate:  [40-109] 109 (05/14 0700) Resp:  [14-30] 29 (05/14 0700) BP: (91-157)/(27-85) 157/85 mmHg (05/14 0700) SpO2:  [97 %-100 %] 100 % (05/14 0821) INTAKE / OUTPUT:  Intake/Output Summary (Last 24 hours) at 07/25/14 0843 Last data filed at 07/25/14 0700  Gross per 24 hour  Intake 1924.1 ml  Output   3775 ml  Net -1850.9 ml    PHYSICAL EXAMINATION: General: ill appearing, cachetic Neuro:   RASS -1 to 0, answered questions and followed instructions this am  HEENT: dry oral mucosa,  edentulous Cardiovascular:  Irregular, tachycardic, 110's Lungs:  Decreased breath sounds, basilar crackles Abdomen:  Soft, no rebound/guarding, hypoactive BS Musculoskeletal:  Decreased muscle bulk Skin:  No rashes  LABS:  CBC  Recent Labs Lab 07/23/14 0210 07/24/14 1001 07/25/14 0256  WBC 12.9* 13.1* 12.5*  HGB 11.8* 11.0* 10.8*  HCT 36.0* 34.4* 33.0*  PLT 42* 42* 46*   Coag's  Recent Labs Lab 07/23/2014 1639 07/29/2014 2301 07/19/14 1031 07/20/14 0530 07/21/14 0250  APTT 32 43* 49*  --   --   INR 2.85* 3.51* 3.15* 2.09* 1.87*   BMET  Recent Labs Lab 07/23/14 0210 07/24/14 1001 07/25/14 0256  NA 150* 148* 146*  K 3.8 2.9* 3.1*  CL 120* 118* 115*  CO2 20* 21* 21*  BUN 97* 89* 80*  CREATININE 2.50* 2.08* 1.78*  GLUCOSE 147* 248* 289*   Electrolytes  Recent Labs Lab 07/22/14 1833 07/23/14 0210 07/24/14 1001 07/25/14 0256  CALCIUM 8.8* 8.8* 8.3* 8.3*  MG 2.5*  --   --   --    Sepsis Markers  Recent Labs Lab 07/29/2014 2301 07/28/2014 2318 07/19/14 0200 07/19/14 1031 07/20/14 0530 07/22/14 0550  LATICACIDVEN  --  11.1* 7.7* 2.7*  --   --   PROCALCITON 2.53  --   --   --  5.74 2.90   ABG  Recent Labs Lab 07/19/14 0913 07/19/14 1558 07/20/14 0420  PHART 7.377 7.353 7.392  PCO2ART 29.1* 31.8* 32.3*  PO2ART 113* 115* 86.9  Liver Enzymes  Recent Labs Lab 07/23/14 0210 07/24/14 1001 07/25/14 0256  AST 98* 57* 46*  ALT 78* 60 53  ALKPHOS 51 73 77  BILITOT 2.4* 1.9* 1.6*  ALBUMIN 2.3* 2.0* 2.0*   Cardiac Enzymes  Recent Labs Lab 07/27/2014 2301 07/19/14 0430 07/19/14 1030  TROPONINI 0.44* 0.53* 0.63*   Glucose  Recent Labs Lab 07/24/14 1221 07/24/14 1550 07/24/14 1935 07/24/14 2358 07/25/14 0403 07/25/14 0726  GLUCAP 223* 201* 192* 211* 253* 196*    Imaging No results found.   ASSESSMENT / PLAN:  PULMONARY A: AECOPD, improved Pleural effusions P:   Brovana, pulmicort with prn albuterol; home breo held Oxygen  to keep SpO2 > 92% Off Solumedrol   CARDIOVASCULAR A:  Sepsis. Hx of CAD chronic systolic CHF (EF 25 to 30% from 05/11/13). PAF. P:  Hemodynamically stable, adequate rate control currently off meds   RENAL A:   AKI >> baseline creatinine 1.29 from 02/25/14-FENa< 1, pre renal Anion gap metabolic acidosis -resolving. Lactic acidosis -resolved Osmolar gap >> measured osmolarity 325, calculated 300- alcohols neg. Hypernatremia /hypokalemia P:   Dc IVFs Follow BMP Free water via tube  Replacing K    GASTROINTESTINAL A:   Abdominal pain. Elevated lFTs -improving, concern for acalculous cholecystitis  Lipase nml P:   NPO - ct Tfs until able to assess for swallowing No indication for BG drain, not a surgical candidate    HEMATOLOGIC A:    thrombocytopenia, suspect due to sepsis Coagulopathy, improving P:  SCD's Dc heparin,  HIT panel not sent since never received heparin this admit  INFECTIOUS A:   Concern for sepsis ?source - acalculous cholecystitis P:   5/8 vancomycin >> 5/11 5/8 zosyn >>  Blood 5/07 >> negative  Plan d/c abx after dose 5/14 (7 days)  ENDOCRINE A:   Hypoglycemia- TSH, cortisol ok P:   monitor   NEUROLOGIC A:   Acute encephalopathy with concern for CVA - probably metabolic; apperas to be improving as renal fxn and Na improve Underlying dementia P:   Resume ASA  Try to minimize precedex -titrate risperdal- qTC limiting  Family - updated children 5/12, DNR issued, they are hopeful of his returning home Appreciate Palliative care assistance, goal meeting this weekend  Summary - Hepato renal failure of unclear etiology ? Sepsis, with dropping plts & severe delerium, have involved palliative care to discuss goals of care given persistent delerium  The patient is critically ill with multiple organ systems failure and requires high complexity decision making for assessment and support, frequent evaluation and titration of therapies,  application of advanced monitoring technologies and extensive interpretation of multiple databases. Critical Care Time devoted to patient care services described in this note independent of APP time is 35 minutes.    Levy Pupaobert Edgard Debord, MD, PhD 07/25/2014, 8:57 AM [facility] [phone] or if no answer [phone]'

## 2014-07-25 NOTE — Progress Notes (Signed)
eLink Physician-Brief Progress Note Patient Name: Danny SisJesse L [name] DOB: [birth year] MRN: [id number]   Date of Service  07/25/2014  HPI/Events of Note  HR in the 110s up to the 130s with PAF on prn lopressor.  No pressors.  BP currently 118/73 (85).  eICU Interventions  Plan: Start scheduled BB 12.5 mg lopressor via tube BID     Intervention Category Intermediate Interventions: Arrhythmia - evaluation and management  DETERDING,ELIZABETH 07/25/2014, 11:23 PM

## 2014-07-25 NOTE — Progress Notes (Signed)
Thank you for consulting Palliative [facility] Team. We have arranged a family meeting for 07/26/14 at 3pm with pt's children and spouse. Staffed case with Dr. Delton CoombesByrum this am, and RN. Mr Manago's mentation has improved slightly and they are attempting to wean down on the precedex and he has been started on Risperdal 0.5mg  BID.  Eduard RouxSarah Deshana Rominger, ANP

## 2014-07-25 NOTE — Clinical Social Work Placement (Signed)
   CLINICAL SOCIAL WORK PLACEMENT  NOTE  Date:  07/25/2014  Patient Details  Name: Danny [name] MRN: [id number] Date of Birth: December 15, 1920  Clinical Social Work is seeking post-discharge placement for this patient at the Skilled  Nursing Facility level of care (*CSW will initial, date and re-position this form in  chart as items are completed):  Yes   Patient/family provided with [facility] Clinical Social Work Department's list of facilities offering this level of care within the geographic area requested by the patient (or if unable, by the patient's family).  Yes   Patient/family informed of their freedom to choose among providers that offer the needed level of care, that participate in Medicare, Medicaid or managed care program needed by the patient, have an available bed and are willing to accept the patient.  Yes   Patient/family informed of [facility]'s ownership interest in [facility] and [facility], as well as of the fact that they are under no obligation to receive care at these facilities.  PASRR submitted to EDS on 07/25/14     PASRR number received on 07/25/14     Existing PASRR number confirmed on       FL2 transmitted to all facilities in geographic area requested by pt/family on       FL2 transmitted to all facilities within larger geographic area on       Patient informed that his/her managed care company has contracts with or will negotiate with certain facilities, including the following:            Patient/family informed of bed offers received.  Patient chooses bed at       Physician recommends and patient chooses bed at      Patient to be transferred to   on  .  Patient to be transferred to facility by       Patient family notified on   of transfer.  Name of family member notified:        PHYSICIAN       Additional Comment:    _______________________________________________ Vaughan BrownerNixon, Lydiana Milley A, LCSW 07/25/2014, 4:17 PM

## 2014-07-25 NOTE — Progress Notes (Signed)
eLink Physician-Brief Progress Note Patient Name: Danny [name] DOB: [birth year] MRN: [id number]   Date of Service  07/25/2014  HPI/Events of Note  Hypokalemia  eICU Interventions  Potassium replaced     Intervention Category Intermediate Interventions: Electrolyte abnormality - evaluation and management  Danny [name] 07/25/2014, 6:42 AM

## 2014-07-25 NOTE — Clinical Social Work Note (Signed)
Clinical Social Work Assessment  Patient Details  Name: Danny [name] MRN: [id number] Date of Birth: March 15, 1920  Date of referral:  07/25/14               Reason for consult:  Facility Placement                Permission sought to share information with:  Case Manager, Magazine features editoracility Contact Representative, Family Supports Permission granted to share information::  Yes, Verbal Permission Granted  Name::      (Malinda Jon BillingsMorrison and Passenger transport managerChynna Nappi)  Agency::   (N/A)  Relationship::   (Daughter and granddaughter )  SolicitorContact Information:   [phone])  Housing/Transportation Living arrangements for the past 2 months:  Single Family Home Source of Information:  Adult Children Patient Interpreter Needed:  None Criminal Activity/Legal Involvement Pertinent to Current Situation/Hospitalization:  No - Comment as needed Significant Relationships:  Adult Children, Spouse Lives with:  Spouse Do you feel safe going back to the place where you live?  Yes Need for family participation in patient care:  Yes (Comment)  Care giving concerns:  No care giving concerns reported by family.   Social Worker assessment / plan:  Visual merchandiserClinical Social Worker spoke with pt's granddaughter, Danny [name] in reference to post-acute placement for SNF. Pt's wife Danny [name] gave CSW permission to discuss SNF with pt's granddaughter. CSW introduced CSW role and SNF process. Pt's gdtr reported pt's family has been discussing discharge options and highly considering SNF placement. Pt's gdtr reported family is meeting with Palliative Care team and MD tomorrow and will determine next stages of care. Pt's gdtr reported pt's family is agreeable to SNF search however currently unsure of what the final plan will be. Pt's gtr advised CSW to contact pt's dtr, Danny [name] also in regards to discharge planning. CSW attempted to contact pt's dtr, Malinda at [phone] and left a message for a returned phone call. No further concerns  reported at this time. CSW will continue to follow pt and pt's family for continued support and to facilitate pt's discharge needs once medically stable.   Employment status:  Retired Database administratornsurance information:  Managed Medicare PT Recommendations:  Skilled Nursing Facility Information / Referral to community resources:  Skilled Nursing Facility  Patient/Family's Response to care:  Pt disoriented. Pt's family agreeable to SNF search.   Patient/Family's Understanding of and Emotional Response to Diagnosis, Current Treatment, and Prognosis:  Pt's gdtr reported family plans to meet with MD and palliative care team tomorrow (5/14) to gain a better understanding of diagnosis, current treatment and likely establish goals of care.   Emotional Assessment Appearance:  Appears stated age Attitude/Demeanor/Rapport:  Unable to Assess Affect (typically observed):  Unable to Assess Orientation:  Oriented to Self Alcohol / Substance use:  Tobacco Use Psych involvement (Current and /or in the community):  No (Comment)  Discharge Needs  Concerns to be addressed:  No discharge needs identified Readmission within the last 30 days:  No Current discharge risk:  None Barriers to Discharge:  No Barriers Identified   Vaughan Brownerixon, Juandavid Dallman A, LCSW 07/25/2014, 3:44 PM

## 2014-07-26 ENCOUNTER — Inpatient Hospital Stay (HOSPITAL_COMMUNITY): Payer: Medicare Other

## 2014-07-26 ENCOUNTER — Encounter (HOSPITAL_COMMUNITY): Payer: Self-pay

## 2014-07-26 DIAGNOSIS — A419 Sepsis, unspecified organism: Principal | ICD-10-CM

## 2014-07-26 DIAGNOSIS — R6520 Severe sepsis without septic shock: Secondary | ICD-10-CM

## 2014-07-26 DIAGNOSIS — Z515 Encounter for palliative care: Secondary | ICD-10-CM

## 2014-07-26 LAB — GLUCOSE, CAPILLARY
GLUCOSE-CAPILLARY: 164 mg/dL — AB (ref 65–99)
GLUCOSE-CAPILLARY: 180 mg/dL — AB (ref 65–99)
GLUCOSE-CAPILLARY: 240 mg/dL — AB (ref 65–99)
Glucose-Capillary: 226 mg/dL — ABNORMAL HIGH (ref 65–99)
Glucose-Capillary: 222 mg/dL — ABNORMAL HIGH (ref 65–99)
Glucose-Capillary: 135 mg/dL — ABNORMAL HIGH (ref 65–99)

## 2014-07-26 LAB — BASIC METABOLIC PANEL
Anion gap: 10 (ref 5–15)
BUN: 62 mg/dL — AB (ref 6–20)
CALCIUM: 8.5 mg/dL — AB (ref 8.9–10.3)
CHLORIDE: 120 mmol/L — AB (ref 101–111)
CO2: 22 mmol/L (ref 22–32)
Creatinine, Ser: 1.52 mg/dL — ABNORMAL HIGH (ref 0.61–1.24)
GFR calc non Af Amer: 38 mL/min — ABNORMAL LOW (ref >60)
GFR, EST AFRICAN AMERICAN: 44 mL/min — AB (ref >60)
Glucose, Bld: 218 mg/dL — ABNORMAL HIGH (ref 65–99)
POTASSIUM: 4 mmol/L (ref 3.5–5.1)
Sodium: 152 mmol/L — ABNORMAL HIGH (ref 135–145)

## 2014-07-26 MED ORDER — FREE WATER
250.0000 mL | Status: DC
  Administered 2014-07-26 – 2014-07-31 (×33): 250 mL

## 2014-07-26 MED ORDER — INSULIN ASPART 100 UNIT/ML ~~LOC~~ SOLN
0.0000 [IU] | SUBCUTANEOUS | Status: DC
  Administered 2014-07-26: 4 [IU] via SUBCUTANEOUS
  Administered 2014-07-26 (×2): 3 [IU] via SUBCUTANEOUS
  Administered 2014-07-27 (×5): 4 [IU] via SUBCUTANEOUS
  Administered 2014-07-28: 7 [IU] via SUBCUTANEOUS
  Administered 2014-07-28 (×3): 4 [IU] via SUBCUTANEOUS
  Administered 2014-07-28: 7 [IU] via SUBCUTANEOUS
  Administered 2014-07-29 (×4): 3 [IU] via SUBCUTANEOUS
  Administered 2014-07-29: 4 [IU] via SUBCUTANEOUS
  Administered 2014-07-30: 3 [IU] via SUBCUTANEOUS
  Administered 2014-07-30: 4 [IU] via SUBCUTANEOUS
  Administered 2014-07-30 – 2014-07-31 (×5): 3 [IU] via SUBCUTANEOUS

## 2014-07-26 MED ORDER — DEXTROSE 5 % IV SOLN
INTRAVENOUS | Status: DC
  Administered 2014-07-26: 50 mL via INTRAVENOUS
  Administered 2014-07-27: 23:00:00 via INTRAVENOUS

## 2014-07-26 MED ORDER — LORAZEPAM 2 MG/ML IJ SOLN
1.0000 mg | Freq: Once | INTRAMUSCULAR | Status: AC
  Administered 2014-07-26: 1 mg via INTRAVENOUS
  Filled 2014-07-26: qty 1

## 2014-07-26 NOTE — Progress Notes (Signed)
PULMONARY / CRITICAL CARE MEDICINE   Name: Danny [name] MRN: [id number] DOB: September 01, 1920    ADMISSION DATE:  07/27/2014 CONSULTATION DATE:  07/19/2014  REFERRING MD :  Waymon AmatoHongalgi  CHIEF COMPLAINT:  Confusion  INITIAL PRESENTATION:  81 yo male smoker with weakness, hypothermia, hypoglycemia, AKI, metabolic acidosis with respiratory alkalosis, and possible sepsis.  STUDIES:  5/07 CT head >> chronic cerebellum and Lt thalamus infarct 5/07 Echo >> EF 20-25%, moD AS/AI, RVSP 65 5/07 Renal u/s >> Markedly thickened gallbladder wall to 14 mm thick with edema/striations within wall without gallstones ; findings are concerning for acalculus cholecystitis 5/11 NSVT  SIGNIFICANT EVENTS: 5/07 Admit, neuro consulted   HISTORY OF PRESENT ILLNESS:   81 yo male was feeling weak.  His family noted he had slurred speech and Rt facial droop.  He was seen in ER with concern for CVA.  He was noted to be hypothermic and blood sugar was 17.  He also had AKI, metabolic acidosis with elevated lactic acid, and hypotension.  He was seen by stroke service.  He was started on Abx, and given dextrose.  He was started on warming blanket.  He was admitted to SDU.  He developed agitation and received several doses of ativan, morphine, and haldol.   SUBJECTIVE:  MS has improved some. Was off precedex completely but had to go back on low dose last night.    VITAL SIGNS: Temp:  [96.5 F (35.8 C)-99.9 F (37.7 C)] 97.9 F (36.6 C) (05/15 0723) Pulse Rate:  [44-125] 86 (05/15 0700) Resp:  [17-34] 20 (05/15 0700) BP: (101-157)/(43-92) 120/60 mmHg (05/15 0700) SpO2:  [93 %-100 %] 100 % (05/15 0700) INTAKE / OUTPUT:  Intake/Output Summary (Last 24 hours) at 07/26/14 0800 Last data filed at 07/26/14 0700  Gross per 24 hour  Intake 1658.7 ml  Output   2030 ml  Net -371.3 ml    PHYSICAL EXAMINATION: General: ill appearing, cachetic Neuro:   RASS 0, answered questions and followed instructions last 24h HEENT:  dry oral mucosa, edentulous Cardiovascular:  Irregular, tachycardic, 100's Lungs:  Decreased breath sounds, basilar crackles Abdomen:  Soft, no rebound/guarding, hypoactive BS Musculoskeletal:  Decreased muscle bulk Skin:  No rashes  LABS:  CBC  Recent Labs Lab 07/23/14 0210 07/24/14 1001 07/25/14 0256  WBC 12.9* 13.1* 12.5*  HGB 11.8* 11.0* 10.8*  HCT 36.0* 34.4* 33.0*  PLT 42* 42* 46*   Coag's  Recent Labs Lab 07/19/14 1031 07/20/14 0530 07/21/14 0250  APTT 49*  --   --   INR 3.15* 2.09* 1.87*   BMET  Recent Labs Lab 07/24/14 1001 07/25/14 0256 07/26/14 0324  NA 148* 146* 152*  K 2.9* 3.1* 4.0  CL 118* 115* 120*  CO2 21* 21* 22  BUN 89* 80* 62*  CREATININE 2.08* 1.78* 1.52*  GLUCOSE 248* 289* 218*   Electrolytes  Recent Labs Lab 07/22/14 1833  07/24/14 1001 07/25/14 0256 07/26/14 0324  CALCIUM 8.8*  < > 8.3* 8.3* 8.5*  MG 2.5*  --   --   --   --   < > = values in this interval not displayed. Sepsis Markers  Recent Labs Lab 07/19/14 1031 07/20/14 0530 07/22/14 0550  LATICACIDVEN 2.7*  --   --   PROCALCITON  --  5.74 2.90   ABG  Recent Labs Lab 07/19/14 0913 07/19/14 1558 07/20/14 0420  PHART 7.377 7.353 7.392  PCO2ART 29.1* 31.8* 32.3*  PO2ART 113* 115* 86.9   Liver Enzymes  Recent  Labs Lab 07/23/14 0210 07/24/14 1001 07/25/14 0256  AST 98* 57* 46*  ALT 78* 60 53  ALKPHOS 51 73 77  BILITOT 2.4* 1.9* 1.6*  ALBUMIN 2.3* 2.0* 2.0*   Cardiac Enzymes  Recent Labs Lab 07/19/14 1030  TROPONINI 0.63*   Glucose  Recent Labs Lab 07/25/14 0726 07/25/14 1234 07/25/14 1558 07/25/14 1924 07/25/14 2354 07/26/14 0405  GLUCAP 196* 133* 170* 171* 180* 226*    Imaging No results found.   ASSESSMENT / PLAN:  PULMONARY A: AECOPD, improved Pleural effusions P:   Brovana, pulmicort with prn albuterol; home breo held Oxygen to keep SpO2 > 92% Off Solumedrol   CARDIOVASCULAR A:  Sepsis. Hx of CAD chronic  systolic CHF (EF 25 to 30% from 05/11/13). PAF. P:  Hemodynamically stable, adequate rate control currently off meds   RENAL A:   AKI >> baseline creatinine 1.29 from 02/25/14-FENa< 1, pre renal Anion gap metabolic acidosis -resolving. Lactic acidosis -resolved Osmolar gap >> measured osmolarity 325, calculated 300- alcohols neg. Hypernatremia /hypokalemia P:   Dc IVFs Follow BMP Free water via tube > increase on 5/15 Replacing K    GASTROINTESTINAL A:   Abdominal pain. Elevated lFTs -improving, concern for acalculous cholecystitis  Lipase nml P:   NPO - ct Tfs until able to assess for swallowing, hopefully on 5/16 No indication for GB drain, not a surgical candidate    HEMATOLOGIC A:    thrombocytopenia, suspect due to sepsis Coagulopathy, improved P:  SCD's Dc'd heparin,  HIT panel not sent since never received heparin this admit  INFECTIOUS A:   Concern for sepsis ?source - acalculous cholecystitis P:   5/8 vancomycin >> 5/11 5/8 zosyn >> 5/14  Blood 5/07 >> negative  D/c'd abx after dose 5/14 (7 days)  ENDOCRINE A:   Hypoglycemia- TSH, cortisol ok P:   monitor   NEUROLOGIC A:   Acute encephalopathy with concern for CVA - probably metabolic; appears to be improving as renal fxn and Na improve Underlying dementia P:   ASA  Try to minimize precedex -titrate risperdal- qTC limiting Need to better correct Na, free water adjusted 5/15  Family - updated children 5/12, DNR issued, they are hopeful of his returning home Appreciate Palliative care assistance, meeting is planned for 5/15 at 3pm  Summary - Hepato renal failure of unclear etiology ? Sepsis, with dropping plts & severe delerium, have involved palliative care to discuss goals of care given persistent delerium  The patient is critically ill with multiple organ systems failure and requires high complexity decision making for assessment and support, frequent evaluation and titration of  therapies, application of advanced monitoring technologies and extensive interpretation of multiple databases. Critical Care Time devoted to patient care services described in this note independent of APP time is 32 minutes.    Danny Pupaobert Uno Esau, MD, PhD 07/26/2014, 8:00 AM [facility] [phone] or if no answer [phone]'

## 2014-07-26 NOTE — Progress Notes (Signed)
Patient ID: GRAEDEN BITNER, male   DOB: 1920-05-07, 81 y.o.   MRN: [id number]  Consultation Note Date: 07/26/2014   Patient Name: Danny [name]  DOB: 07-28-1920  MRN: [id number]  Age / Sex: 81 y.o., male   PCP: Renato Shin, MD Referring Physician: Rigoberto Noel, MD  Reason for Consultation: Establishing goals of care  Palliative Care Assessment and Plan Summary of Established Goals of Care and Medical Treatment Preferences    Palliative Care Discussion Held Today Contacts/Participants in Discussion: Primary Decision Maker: There are 5 primary children that act as decision makers. He does have a living spouse. Met with Daughter Cinda Quest. No official [facility] POA.    Pt is a 81 yo man with h/o systolic HF with EF of 85%, CAD, COPD adm with worseing confusion and had facial drooping . Code Stroke was called on arrival to ED. His glucose was 17 and he was hypothermic. Overall impression at this point he was septic with likely source cholecystitis. He has had protracted delirium and has been on precedex. He has been able to be off precedex for extended periods of time. His delirium is worse at night. Additionally he is on Risperdal .5 bid  Other family member were unable to meet today and met with daughter Cinda Quest this am. Pt is a DNR and family is comfortable with that decision. We did talk about things that pt could encounter in the future especially after this type of illness: debility, difficulty swallowing. Pt currently has a Panda and has been too confused to eat. Daughter reports that as a family they have not discussed feeding tubes. She does report that her father wishes to die at home " and let be what will be". Family seems united in that they recognize that he likely will need rehab. They have had good success with their mother with in-home rehab and this would be their initial preference . They are not opposed to a facility if that becomes necessary for short term rehab. Hospice in their  opinion for " 90 days or less". They have had less than optimal experiences with hospice apparently. Reviewed MOST form as a future decision making guide and left [facility] booklet.  Code Status/Advance Care Planning:  DNR  Symptom Management:   Symptoms being managed by CCM  Psycho-social/Spiritual:   Support System: Yes. Large supportive family. 9 living children in all plus spouse  Desire for further Chaplaincy support:no  Prognosis: Unable to determine  Discharge Planning:  Home with [place]       Chief Complaint/History of Present Illness: Pt is a 81 yo man with h/o systolic hear failure, CAD, COPD, admitted after becoming more confused, and facial drooping seen. Code stroke was called, and he was hypothermic. Pt admitted with sepsis, likely source cholecystitis. He has and protracted delirium but is showing some medical improvement. Primary Diagnoses  Present on Admission:  . CORONARY ATHEROSCLEROSIS, NATIVE VESSEL . Atrial fibrillation . Hypoglycemia . Hypothermia . Memory loss . Systolic CHF, chronic . Elevated troponin . AKI (acute kidney injury) . Metabolic encephalopathy . Severe sepsis  Palliative Review of Systems: Unable to perform. Confused I have reviewed the medical record, interviewed the patient and family, and examined the patient. The following aspects are pertinent.  Past Medical History  Diagnosis Date  . DIABETES MELLITUS, TYPE II 12/06/2006  . NEPHROPATHY, DIABETIC 12/06/2006  . HYPERLIPIDEMIA 12/06/2006  . HYPOKALEMIA 01/12/2009  . TOBACCO USER 05/13/2009  . HYPERTENSION 12/06/2006  .  CORONARY ATHEROSCLEROSIS, NATIVE VESSEL 05/13/2009  . Atrial fibrillation 05/04/2009  . COPD 12/06/2006  . GERD 12/06/2006  . DIVERTICULOSIS, COLON 12/06/2006  . CONSTIPATION 12/20/2007  . RECTAL BLEEDING 12/20/2007  . RENAL INSUFFICIENCY 12/20/2007  . BACK PAIN, LUMBAR 01/21/2008  . DIZZINESS OR VERTIGO 12/06/2006  . ABNORMAL EKG 05/13/2009  .  Diastolic dysfunction   . Blindness of right eye    History   Social History  . Marital Status: Married    Spouse Name: N/A  . Number of Children: N/A  . Years of Education: N/A   Occupational History  . Retired    Social History Main Topics  . Smoking status: Current Every Day Smoker -- 1.00 packs/day for 73 years    Types: Cigarettes  . Smokeless tobacco: Never Used  . Alcohol Use: No  . Drug Use: No  . Sexual Activity: No   Other Topics Concern  . None   Social History Narrative   Family History  Problem Relation Age of Onset  . Cancer Neg Hx   . Hypertension Other     Significant for hypertension  . Diabetes Mellitus II Mother   . Asthma Brother    Scheduled Meds: . antiseptic oral rinse  7 mL Mouth Rinse q12n4p  . arformoterol  15 mcg Nebulization BID  . budesonide (PULMICORT) nebulizer solution  0.25 mg Nebulization BID  . chlorhexidine  15 mL Mouth Rinse BID  . donepezil  10 mg Oral QHS  . free water  250 mL Per Tube 6 times per day  . insulin aspart  0-9 Units Subcutaneous 6 times per day  . metoprolol tartrate  12.5 mg Per Tube BID  . pantoprazole (PROTONIX) IV  40 mg Intravenous Q24H  . risperiDONE  0.5 mg Per Tube BID   Continuous Infusions: . dexmedetomidine 0.502 mcg/kg/hr (07/26/14 0700)  . dextrose 50 mL/hr at 07/26/14 0900  . feeding supplement (VITAL AF 1.2 CAL) 1,000 mL (07/26/14 0900)   PRN Meds:.acetaminophen, albuterol, dextromethorphan-guaiFENesin, haloperidol lactate, metoprolol, ondansetron **OR** ondansetron (ZOFRAN) IV Medications Prior to Admission:  Prior to Admission medications   Medication Sig Start Date End Date Taking? Authorizing Provider  albuterol (PROVENTIL HFA;VENTOLIN HFA) 108 (90 BASE) MCG/ACT inhaler Inhale 1-2 puffs into the lungs every 6 (six) hours as needed for wheezing or shortness of breath. 02/20/14  Yes Linton Flemings, MD  aspirin 81 MG tablet Take 81 mg by mouth every evening.    Yes Historical Provider, MD    dextromethorphan-guaiFENesin (MUCINEX DM) 30-600 MG per 12 hr tablet Take 1 tablet by mouth 2 (two) times daily as needed for cough.   Yes Historical Provider, MD  Fluticasone Furoate-Vilanterol (BREO ELLIPTA) 100-25 MCG/INH AEPB Inhale 1 puff into the lungs every morning. 07/15/14  Yes Tanda Rockers, MD  furosemide (LASIX) 20 MG tablet Take 20 mg by mouth daily.   Yes Historical Provider, MD  zolpidem (AMBIEN) 5 MG tablet Take 1 tablet (5 mg total) by mouth at bedtime as needed for sleep. Patient not taking: Reported on 07/16/2014 03/31/14   Renato Shin, MD   No Known Allergies CBC:    Component Value Date/Time   WBC 12.5* 07/25/2014 0256   HGB 10.8* 07/25/2014 0256   HCT 33.0* 07/25/2014 0256   PLT 46* 07/25/2014 0256   MCV 91.2 07/25/2014 0256   NEUTROABS 9.0* 07/26/2014 2301   LYMPHSABS 0.3* 07/30/2014 2301   MONOABS 1.7* 07/29/2014 2301   EOSABS 0.0 07/20/2014 2301   BASOSABS 0.0 07/20/2014 2301  Comprehensive Metabolic Panel:    Component Value Date/Time   NA 152* 07/26/2014 0324   K 4.0 07/26/2014 0324   CL 120* 07/26/2014 0324   CO2 22 07/26/2014 0324   BUN 62* 07/26/2014 0324   CREATININE 1.52* 07/26/2014 0324   CREATININE 1.29 02/25/2014 1741   GLUCOSE 218* 07/26/2014 0324   CALCIUM 8.5* 07/26/2014 0324   CALCIUM 9.4 01/11/2012 1029   AST 46* 07/25/2014 0256   ALT 53 07/25/2014 0256   ALKPHOS 77 07/25/2014 0256   BILITOT 1.6* 07/25/2014 0256   PROT 5.4* 07/25/2014 0256   ALBUMIN 2.0* 07/25/2014 0256    Physical Exam: Vital Signs: BP 129/74 mmHg  Pulse 94  Temp(Src) 97.9 F (36.6 C) (Oral)  Resp 15  Ht _0  (1.727 m)  Wt 64.7 kg (142 lb 10.2 oz)  BMI 21.69 kg/m2  SpO2 98% SpO2: SpO2: 98 % O2 Device: O2 Device: Not Delivered O2 Flow Rate: O2 Flow Rate (L/min): 1 L/min Intake/output summary:  Intake/Output Summary (Last 24 hours) at 07/26/14 1022 Last data filed at 07/26/14 0900  Gross per 24 hour  Intake 1536.53 ml  Output   1830 ml  Net -293.47  ml   LBM:  07/26/14 Baseline Weight: Weight: 60.3 kg (132 lb 15 oz) Most recent weight: Weight: 64.7 kg (142 lb 10.2 oz)            Palliative Performance Scale: 30-40%              Additional Data Reviewed: Recent Labs     07/24/14  1001  07/25/14  0256  07/26/14  0324  WBC  13.1*  12.5*   --   HGB  11.0*  10.8*   --   PLT  42*  46*   --   NA  148*  146*  152*  BUN  89*  80*  62*  CREATININE  2.08*  1.78*  1.52*     Time In: 0900 Time Out: 1015 Time Total: 75 min  Greater than 50%  of this time was spent counseling and coordinating care related to the above assessment and plan. At this point daughter verbalizes no further Palliative Care needs but will shadow chart for clinical changes and daughter states if things change would like to hear from our service at that time.  Signed by: Dory Horn, NP  Dory Horn, NP  07/26/2014, 10:22 AM  Please contact Palliative Medicine Team phone at [phone] for questions and concerns.   2

## 2014-07-26 NOTE — Progress Notes (Signed)
PT NTS per MD order via both nares for thick, white moderate amount of secretions.  PT tolerated well.  RN at bedside to assist with pt.  Sats 100% on RA, HR 71, RR 18.  RT will continue to monitor.

## 2014-07-26 NOTE — Progress Notes (Signed)
Patient has been very restless and agitated.  Patient has bee wearing safety mitts throughout the morning.  Has pulled off condom catheter and has had to have it replaced 4 times.  The frequent removal by the patient had resulted in a small skin tear on the tope of the patient's genitalia.  Also; patient has managed to partially pull panda tube two times and it has had to be readvanced and reconfirmed by x-ray.  Precedex order has been discontinued and patient has been weaning off of the medication.  Given 2mg  of haldol with no change.  Dr. Delton CoombesByrum notified of patient's agitation.  Requested a Comptrollersitter and MD placed order for ativan.  Will continue to monitor and assess. Tommi EmeryHINTZ, Betzayda Braxton M

## 2014-07-27 ENCOUNTER — Inpatient Hospital Stay (HOSPITAL_COMMUNITY): Payer: Medicare Other

## 2014-07-27 ENCOUNTER — Ambulatory Visit: Payer: Self-pay | Admitting: Internal Medicine

## 2014-07-27 LAB — BASIC METABOLIC PANEL
Anion gap: 8 (ref 5–15)
BUN: 49 mg/dL — AB (ref 6–20)
CHLORIDE: 121 mmol/L — AB (ref 101–111)
CO2: 24 mmol/L (ref 22–32)
CREATININE: 1.27 mg/dL — AB (ref 0.61–1.24)
Calcium: 8.6 mg/dL — ABNORMAL LOW (ref 8.9–10.3)
GFR calc Af Amer: 55 mL/min — ABNORMAL LOW (ref >60)
GFR calc non Af Amer: 48 mL/min — ABNORMAL LOW (ref >60)
Glucose, Bld: 182 mg/dL — ABNORMAL HIGH (ref 65–99)
Potassium: 3.9 mmol/L (ref 3.5–5.1)
Sodium: 153 mmol/L — ABNORMAL HIGH (ref 135–145)

## 2014-07-27 LAB — GLUCOSE, CAPILLARY
GLUCOSE-CAPILLARY: 131 mg/dL — AB (ref 65–99)
GLUCOSE-CAPILLARY: 168 mg/dL — AB (ref 65–99)
GLUCOSE-CAPILLARY: 185 mg/dL — AB (ref 65–99)
Glucose-Capillary: 177 mg/dL — ABNORMAL HIGH (ref 65–99)
Glucose-Capillary: 183 mg/dL — ABNORMAL HIGH (ref 65–99)
Glucose-Capillary: 197 mg/dL — ABNORMAL HIGH (ref 65–99)

## 2014-07-27 NOTE — Progress Notes (Addendum)
PULMONARY / CRITICAL CARE MEDICINE   Name: Danny [name] MRN: [id number] DOB: 1920/12/20    ADMISSION DATE:  15-Aug-2014 CONSULTATION DATE:  07/19/2014  REFERRING MD :  Waymon Amato  CHIEF COMPLAINT:  Confusion  INITIAL PRESENTATION:  81 yo male smoker with weakness, hypothermia, hypoglycemia, AKI, metabolic acidosis with respiratory alkalosis, and possible sepsis.  STUDIES:  5/07 CT head >> chronic cerebellum and Lt thalamus infarct 5/07 Echo >> EF 20-25%, moD AS/AI, RVSP 65 5/07 Renal u/s >> Markedly thickened gallbladder wall to 14 mm thick with edema/striations within wall without gallstones ; findings are concerning for acalculus cholecystitis 5/11 NSVT  SIGNIFICANT EVENTS: 5/07 Admit, neuro consulted  HISTORY OF PRESENT ILLNESS:   81 yo male was feeling weak.  His family noted he had slurred speech and Rt facial droop.  He was seen in ER with concern for CVA.  He was noted to be hypothermic and blood sugar was 17.  He also had AKI, metabolic acidosis with elevated lactic acid, and hypotension.  He was seen by stroke service.  He was started on Abx, and given dextrose.  He was started on warming blanket.  He was admitted to SDU.  He developed agitation and received several doses of ativan, morphine, and haldol.  SUBJECTIVE:  Completely unresponsive this AM, protecting airway with an intact gag.  VITAL SIGNS: Temp:  [95.7 F (35.4 C)-99.8 F (37.7 C)] 99.8 F (37.7 C) (05/16 0700) Pulse Rate:  [66-125] 112 (05/16 0900) Resp:  [17-38] 30 (05/16 0900) BP: (119-157)/(52-94) 143/80 mmHg (05/16 0900) SpO2:  [94 %-100 %] 100 % (05/16 0900) Weight:  [63.2 kg (139 lb 5.3 oz)] 63.2 kg (139 lb 5.3 oz) (05/16 0500) INTAKE / OUTPUT:  Intake/Output Summary (Last 24 hours) at 07/27/14 0934 Last data filed at 07/27/14 0900  Gross per 24 hour  Intake   2912 ml  Output    275 ml  Net   2637 ml   PHYSICAL EXAMINATION: General: ill appearing, cachetic Neuro:   RASS 0, unresponsive with  an intact gag HEENT: dry oral mucosa, edentulous Cardiovascular:  Irregular, tachycardic, 100's Lungs:  Decreased breath sounds, basilar crackles Abdomen:  Soft, no rebound/guarding, hypoactive BS Musculoskeletal:  Decreased muscle bulk Skin:  No rashes  LABS:  CBC  Recent Labs Lab 07/23/14 0210 07/24/14 1001 07/25/14 0256  WBC 12.9* 13.1* 12.5*  HGB 11.8* 11.0* 10.8*  HCT 36.0* 34.4* 33.0*  PLT 42* 42* 46*   Coag's  Recent Labs Lab 07/21/14 0250  INR 1.87*   BMET  Recent Labs Lab 07/25/14 0256 07/26/14 0324 07/27/14 0204  NA 146* 152* 153*  K 3.1* 4.0 3.9  CL 115* 120* 121*  CO2 21* 22 24  BUN 80* 62* 49*  CREATININE 1.78* 1.52* 1.27*  GLUCOSE 289* 218* 182*   Electrolytes   Recent Labs Lab 07/22/14 1833  07/25/14 0256 07/26/14 0324 07/27/14 0204  CALCIUM 8.8*  < > 8.3* 8.5* 8.6*  MG 2.5*  --   --   --   --   < > = values in this interval not displayed. Sepsis Markers  Recent Labs Lab 07/22/14 0550  PROCALCITON 2.90   ABG No results for input(s): PHART, PCO2ART, PO2ART in the last 168 hours. Liver Enzymes  Recent Labs Lab 07/23/14 0210 07/24/14 1001 07/25/14 0256  AST 98* 57* 46*  ALT 78* 60 53  ALKPHOS 51 73 77  BILITOT 2.4* 1.9* 1.6*  ALBUMIN 2.3* 2.0* 2.0*   Cardiac Enzymes No results for  input(s): TROPONINI, PROBNP in the last 168 hours. Glucose  Recent Labs Lab 07/26/14 0719 07/26/14 1247 07/26/14 1605 07/26/14 1914 07/26/14 2324 07/27/14 0747  GLUCAP 222* 240* 164* 135* 131* 183*   Imaging Dg Abd Portable 1v  07/26/2014   CLINICAL DATA:  Feeding tube placement.  EXAM: PORTABLE ABDOMEN - 1 VIEW  COMPARISON:  07/26/2014  FINDINGS: A small bore feeding tube is identified overlying the mid stomach.  Consolidation/ atelectasis in the left lower lung again noted.  The bowel gas pattern is unremarkable.  IMPRESSION: Small bore feeding tube with tip overlying the mid stomach.   Electronically Signed   By: Harmon PierJeffrey  Hu M.D.    On: 07/26/2014 17:00   Dg Abd Portable 1v  07/26/2014   CLINICAL DATA:  Assess feeding tube placement  EXAM: PORTABLE ABDOMEN - 1 VIEW  COMPARISON:  Radiograph 07/22/2014  FINDINGS: There is a feeding tube with tip in the gastric antrum versus first portion the duodenum. No dilated loops of large or small bowel.  IMPRESSION: Feeding tube with tip in the distal stomach/proximal duodenum.   Electronically Signed   By: Genevive BiStewart  Edmunds M.D.   On: 07/26/2014 14:00   ASSESSMENT / PLAN:  PULMONARY A: AECOPD, improved Pleural effusions P:   Brovana, pulmicort with prn albuterol; home breo held Oxygen to keep SpO2 > 92% Off Solumedrol  Watch for signs of respiratory failure, if occurs then transition to comfort care.  CARDIOVASCULAR A:  Sepsis. Hx of CAD chronic systolic CHF (EF 25 to 30% from 05/11/13). PAF. P:  Hemodynamically stable, adequate rate control currently off meds  RENAL A:   AKI >> baseline creatinine 1.29 from 02/25/14-FENa< 1, pre renal Anion gap metabolic acidosis -resolving. Lactic acidosis -resolved Osmolar gap >> measured osmolarity 325, calculated 300- alcohols neg. Hypernatremia /hypokalemia P:   Dc IVFs Follow BMP Free water via tube > increased on 5/15 to 250 q5 with 50 ml/hr of D5W, will continue for now. Replacing electrolytes as indicated.  GASTROINTESTINAL A:   Abdominal pain. Elevated lFTs -improving, concern for acalculous cholecystitis  Lipase nml P:   NPO - ct Tfs until mental status improves. No indication for GB drain, not a surgical candidate.  HEMATOLOGIC A:    thrombocytopenia, suspect due to sepsis Coagulopathy, improved P:  SCD's Dc'd heparin, HIT panel not sent since never received heparin this admit  INFECTIOUS A:   Concern for sepsis ?source - acalculous cholecystitis P:   5/8 vancomycin >> 5/11 5/8 zosyn >> 5/14  Blood 5/07 >> negative  D/c'd abx after dose 5/14 (7 days complete)  ENDOCRINE A:   Hypoglycemia- TSH,  cortisol ok P:   monitor  NEUROLOGIC A:   Acute encephalopathy with concern for CVA - probably metabolic; appears to be improving as renal fxn and Na improve Underlying dementia P:   ASA  Try to minimize precedex -titrate risperdal- qTC limiting Need to better correct Na, free water to be continued. Head CT ordered without contrast.  Family - spoke with sister over the phone, she is completely unaware of the patient's medical condition.  I called her to inform her that patient is unresponsive and that we should consider palliation.  She was rather upset claiming that nobody has ever spoke to her or informed her of his health conditions.  Arranged for a meeting in AM with MD.  [facility], PCCM will sign off.  Summary - Hepatorenal failure of unclear etiology ? Sepsis, with dropping plts &  severe delerium, have involved palliative care to discuss goals of care given persistent delerium  The patient is critically ill with multiple organ systems failure and requires high complexity decision making for assessment and support, frequent evaluation and titration of therapies, application of advanced monitoring technologies and extensive interpretation of multiple databases.   Critical Care Time devoted to patient care services described in this note is  35  Minutes. This time reflects time of care of this signee Dr Koren BoundWesam Yacoub. This critical care time does not reflect procedure time, or teaching time or supervisory time of PA/NP/Med student/Med Resident etc but could involve care discussion time.  Alyson ReedyWesam G. Yacoub, M.D. [facility]. Pager: [phone]. After hours pager: [phone].

## 2014-07-27 NOTE — Progress Notes (Signed)
PT Cancellation Note  Patient Details Name: Derinda SisJesse L Stueve MRN: [id number] DOB: 09-21-1920   Cancelled Treatment:    Reason Eval/Treat Not Completed: Medical issues which prohibited therapy. Noted pt with decline in status with ?move to comfort care (awaiting family decision per chart).  Will follow and proceed with therapy if pt able to respond/cooperate.   Yaira Bernardi 07/27/2014, 2:23 PM Pager [phone]

## 2014-07-27 NOTE — Progress Notes (Signed)
Pt more lethargic at this time than at 1900, responds to stimulus but goes right back to sleep. Pt is MAE, left pupil is reactive, unable to assess R. Noted also increase in Resp Rate and pattern, O2 sat >95 on RA, RR 28-40. Also noted bile drainage on side of his mouth. Panda in place according to XRay and ascultation, unable to pull back residuals, TF infusing at 55/hr. Lungs diminshed but equal.  Dr Vassie LollAlva notified of change in patient, order received to check PCXR in AM.

## 2014-07-28 ENCOUNTER — Inpatient Hospital Stay (HOSPITAL_COMMUNITY): Payer: Medicare Other

## 2014-07-28 DIAGNOSIS — R410 Disorientation, unspecified: Secondary | ICD-10-CM

## 2014-07-28 DIAGNOSIS — E870 Hyperosmolality and hypernatremia: Secondary | ICD-10-CM

## 2014-07-28 DIAGNOSIS — E440 Moderate protein-calorie malnutrition: Secondary | ICD-10-CM

## 2014-07-28 LAB — BASIC METABOLIC PANEL
ANION GAP: 8 (ref 5–15)
BUN: 43 mg/dL — ABNORMAL HIGH (ref 6–20)
CHLORIDE: 113 mmol/L — AB (ref 101–111)
CO2: 22 mmol/L (ref 22–32)
Calcium: 8.4 mg/dL — ABNORMAL LOW (ref 8.9–10.3)
Creatinine, Ser: 1.1 mg/dL (ref 0.61–1.24)
GFR calc Af Amer: >60 mL/min (ref >60)
GFR calc non Af Amer: 57 mL/min — ABNORMAL LOW (ref >60)
GLUCOSE: 222 mg/dL — AB (ref 65–99)
Potassium: 4 mmol/L (ref 3.5–5.1)
SODIUM: 143 mmol/L (ref 135–145)

## 2014-07-28 LAB — CBC
HCT: 32.6 % — ABNORMAL LOW (ref 39.0–52.0)
Hemoglobin: 10.1 g/dL — ABNORMAL LOW (ref 13.0–17.0)
MCH: 29.4 pg (ref 26.0–34.0)
MCHC: 31 g/dL (ref 30.0–36.0)
MCV: 94.8 fL (ref 78.0–100.0)
Platelets: 123 10*3/uL — ABNORMAL LOW (ref 150–400)
RBC: 3.44 MIL/uL — ABNORMAL LOW (ref 4.22–5.81)
RDW: 18.7 % — ABNORMAL HIGH (ref 11.5–15.5)
WBC: 11.5 10*3/uL — ABNORMAL HIGH (ref 4.0–10.5)

## 2014-07-28 LAB — GLUCOSE, CAPILLARY
GLUCOSE-CAPILLARY: 201 mg/dL — AB (ref 65–99)
Glucose-Capillary: 165 mg/dL — ABNORMAL HIGH (ref 65–99)
Glucose-Capillary: 172 mg/dL — ABNORMAL HIGH (ref 65–99)
Glucose-Capillary: 177 mg/dL — ABNORMAL HIGH (ref 65–99)
Glucose-Capillary: 211 mg/dL — ABNORMAL HIGH (ref 65–99)

## 2014-07-28 LAB — PHOSPHORUS: Phosphorus: 2.8 mg/dL (ref 2.5–4.6)

## 2014-07-28 LAB — MAGNESIUM: Magnesium: 1.9 mg/dL (ref 1.7–2.4)

## 2014-07-28 MED ORDER — LISINOPRIL 5 MG PO TABS
5.0000 mg | ORAL_TABLET | Freq: Every day | ORAL | Status: DC
  Administered 2014-07-28 – 2014-07-31 (×4): 5 mg
  Filled 2014-07-28 (×4): qty 1

## 2014-07-28 MED ORDER — FUROSEMIDE 10 MG/ML IJ SOLN
40.0000 mg | Freq: Once | INTRAMUSCULAR | Status: AC
  Administered 2014-07-28: 40 mg via INTRAVENOUS
  Filled 2014-07-28: qty 4

## 2014-07-28 MED ORDER — ASPIRIN 81 MG PO CHEW
81.0000 mg | CHEWABLE_TABLET | Freq: Every day | ORAL | Status: DC
  Administered 2014-07-28 – 2014-07-31 (×4): 81 mg
  Filled 2014-07-28 (×4): qty 1

## 2014-07-28 NOTE — Progress Notes (Signed)
PROGRESS NOTE  Derinda SisJesse L Decelle WUJ:[id number] DOB: 11-Mar-1943 DOA: 07/17/2014 PCP: Romero BellingELLISON, SEAN, MD  HPI/Recap of past 24 hours:  Still confused, not following commend, only oriented to self, RN reported patient accidentally pull on the feeding tube, but did not full it out. Feeding tube repositioned. Xray confirmed position.   Assessment/Plan: Principal Problem:   Metabolic encephalopathy Active Problems:   CORONARY ATHEROSCLEROSIS, NATIVE VESSEL   Atrial fibrillation   Elevated troponin   Systolic CHF, chronic   Memory loss   Hypoglycemia   Hypothermia   AKI (acute kidney injury)   Renal failure, acute on chronic   Altered mental status   Malnutrition of moderate degree   Severe sepsis   Palliative care encounter  Metabolic encephalopathy/delirium: Currently with mitten on, was on precedex drip in the icu, [facility] no off all sedatives, still confused, on feeding tube since 5/11. Discussed with daughter Juliette AlcideMelinda over the phone, encourage her to  Make decision for hospice vs permanent feeding tube, daughter states that they will talk to palliative care on Saturday. Meantime hope patient will improve.  Hypernatremia: resolving, continue free water flush per feeding tube for one more day. /dc d5.  Acute of chronic systolic CHF, EF 95-62%20-25% (07/19/2014), cxr persistent bilateral infiltrate,currently on room air, does have intermittent tachypnea,  lasix 40mg  iv x1 on 5/17, likely will need daily lasix for the next couple of days. Restart lisinopril on 5/17 per tube since cr improved.   PAF, h/o bradycardia, currently regular. per outpatient cardiology note. Avoid nodal blocking agent, per cardiology, not a candidate for anticoagulation, has been on low dose asa, which is restarted on 5/17.  HTN: restart lasix/linisiopril/asa on 5/17. bp meds need further titration.  ARF, cr normalized on 5/17. Continue monitor cr with restart of lasix.  Elevation of lft: ?  Cholecystitis, not a candidate for surgery, treated with zosyn x7days, now off all abx, ALT back to normal range on 5/13,, Tbili/ast still elevated, but continue to improve.  NIDDM2, a1c 6.7 on SSI here.  COPD, currently on wheezing.  Code Status: DNR  Family Communication: daughter Darnelle CatalanMalinda  Disposition Plan: TBD, palliative team following   Consultants:  Palliative team  Procedures:  none  Antibiotics:  Finished treatment, currently no on any as of 5/17.   Objective: BP 152/68 mmHg  Pulse 82  Temp(Src) 98.1 F (36.7 C) (Oral)  Resp 28  Ht 5\' 8"  (1.727 m)  Wt 68.947 kg (152 lb)  BMI 23.12 kg/m2  SpO2 95%  Intake/Output Summary (Last 24 hours) at 07/28/14 1535 Last data filed at 07/28/14 1200  Gross per 24 hour  Intake 2196.67 ml  Output    210 ml  Net 1986.67 ml   Filed Weights   07/24/14 0600 07/27/14 0500 07/28/14 0500  Weight: 64.7 kg (142 lb 10.2 oz) 63.2 kg (139 lb 5.3 oz) 68.947 kg (152 lb)    Exam:   General:  Frail, confused elderly gentleman laying in bed with mittens on.  Cardiovascular: RRR  Respiratory: diminished, +crackles, +rhonchi, no wheezing  Abdomen: Soft/ND/NT, positive BS  Musculoskeletal: No Edema  Neuro: confused, moving all extremity spontaneously.    Data Reviewed: Basic Metabolic Panel:  Recent Labs Lab 07/22/14 1833  07/24/14 1001 07/25/14 0256 07/26/14 0324 07/27/14 0204 07/28/14 0420  NA 149*  < > 148* 146* 152* 153* 143  K 4.0  < > 2.9* 3.1* 4.0 3.9 4.0  CL 117*  < > 118* 115* 120* 121* 113*  CO2 21*  < > 21* 21* 22 24 22   GLUCOSE 145*  < > 248* 289* 218* 182* 222*  BUN 93*  < > 89* 80* 62* 49* 43*  CREATININE 2.29*  < > 2.08* 1.78* 1.52* 1.27* 1.10  CALCIUM 8.8*  < > 8.3* 8.3* 8.5* 8.6* 8.4*  MG 2.5*  --   --   --   --   --  1.9  PHOS  --   --   --   --   --   --  2.8  < > = values in this interval not displayed. Liver Function Tests:  Recent Labs Lab 07/23/14 0210 07/24/14 1001 07/25/14 0256    AST 98* 57* 46*  ALT 78* 60 53  ALKPHOS 51 73 77  BILITOT 2.4* 1.9* 1.6*  PROT 5.7* 5.2* 5.4*  ALBUMIN 2.3* 2.0* 2.0*   No results for input(s): LIPASE, AMYLASE in the last 168 hours. No results for input(s): AMMONIA in the last 168 hours. CBC:  Recent Labs Lab 07/22/14 0550 07/23/14 0210 07/24/14 1001 07/25/14 0256 07/28/14 0420  WBC 13.0* 12.9* 13.1* 12.5* 11.5*  HGB 12.9* 11.8* 11.0* 10.8* 10.1*  HCT 40.0 36.0* 34.4* 33.0* 32.6*  MCV 93.9 92.5 91.5 91.2 94.8  PLT 53* 42* 42* 46* 123*   Cardiac Enzymes:   No results for input(s): CKTOTAL, CKMB, CKMBINDEX, TROPONINI in the last 168 hours. BNP (last 3 results)  Recent Labs  2014/09/24 1944  BNP >4500.0*    ProBNP (last 3 results) No results for input(s): PROBNP in the last 8760 hours.  CBG:  Recent Labs Lab 07/27/14 2004 07/28/14 0001 07/28/14 0405 07/28/14 0821 07/28/14 1217  GLUCAP 185* 177* 201* 172* 211*    Recent Results (from the past 240 hour(s))  Culture, blood (routine x 2)     Status: None   Collection Time: 2014/09/24  5:40 PM  Result Value Ref Range Status   Specimen Description BLOOD LEFT WRIST  Final   Special Requests BOTTLES DRAWN AEROBIC AND ANAEROBIC 4CC  Final   Culture   Final    NO GROWTH 5 DAYS Performed at [facility]    Report Status 07/24/2014 FINAL  Final  Culture, blood (routine x 2)     Status: None   Collection Time: 2014/09/24  5:40 PM  Result Value Ref Range Status   Specimen Description BLOOD RIGHT WRIST  Final   Special Requests BOTTLES DRAWN AEROBIC AND ANAEROBIC 4CC  Final   Culture   Final    NO GROWTH 5 DAYS Performed at [facility]    Report Status 07/24/2014 FINAL  Final  MRSA PCR Screening     Status: None   Collection Time: 07/19/14  7:00 AM  Result Value Ref Range Status   MRSA by PCR NEGATIVE NEGATIVE Final    Comment:        The GeneXpert MRSA Assay (FDA approved for NASAL specimens only), is one component of a comprehensive MRSA  colonization surveillance program. It is not intended to diagnose MRSA infection nor to guide or monitor treatment for MRSA infections.   Clostridium Difficile by PCR     Status: None   Collection Time: 07/24/14 11:00 PM  Result Value Ref Range Status   C difficile by pcr NEGATIVE NEGATIVE Final     Studies: Ct Head Wo Contrast  07/27/2014   CLINICAL DATA:  Altered mental status, history type 2 diabetes, smoking, hypertension, coronary artery disease, COPD  EXAM: CT HEAD  WITHOUT CONTRAST  TECHNIQUE: Contiguous axial images were obtained from the base of the skull through the vertex without intravenous contrast.  COMPARISON:  2014-07-29  FINDINGS: RIGHT optic globe prosthesis.  Generalized atrophy.  Normal ventricular morphology.  No midline shift or mass effect.  Brain parenchyma otherwise normal appearance.  No intracranial hemorrhage, mass lesion, or evidence acute infarction.  No extra-axial fluid collections.  Bones and sinuses unremarkable.  IMPRESSION: Generalized atrophy.  No acute intracranial abnormalities.   Electronically Signed   By: Ulyses Southward M.D.   On: 07/27/2014 21:21   Dg Chest Port 1 View  07/27/2014   CLINICAL DATA:  81 year old male with diabetes, high blood pressure and atrial fibrillation. Respiratory failure. Shortness breath. Subsequent encounter.  EXAM: PORTABLE CHEST - 1 VIEW  COMPARISON:  07/22/2014.  FINDINGS: Lung bases not entirely included on present exam.  Cardiomegaly.  Hazy parenchymal changes left mid-lower lung zone right lung base may represent posteriorly layering pleural fluid. Basilar infiltrate not excluded in proper clinical setting.  Pulmonary vascular congestion/ pulmonary edema.  Calcified tortuous aorta.  No gross pneumothorax.  Bilateral shoulder joint degenerative changes.  IMPRESSION: Overall no significant change in asymmetric airspace disease as detailed above.   Electronically Signed   By: Lacy Duverney M.D.   On: 07/27/2014 07:34    Scheduled  Meds: . antiseptic oral rinse  7 mL Mouth Rinse q12n4p  . arformoterol  15 mcg Nebulization BID  . budesonide (PULMICORT) nebulizer solution  0.25 mg Nebulization BID  . chlorhexidine  15 mL Mouth Rinse BID  . donepezil  10 mg Oral QHS  . free water  250 mL Per Tube 6 times per day  . insulin aspart  0-20 Units Subcutaneous 6 times per day  . metoprolol tartrate  12.5 mg Per Tube BID  . pantoprazole (PROTONIX) IV  40 mg Intravenous Q24H    Continuous Infusions: . feeding supplement (VITAL AF 1.2 CAL) 1,000 mL (07/28/14 0926)     Time spent: >52mins  La Shehan MD, PhD  [facility] Pager [phone]. If 7PM-7AM, please contact night-coverage at [email or web address], password [facility] 07/28/2014, 3:35 PM  LOS: 10 days

## 2014-07-28 NOTE — Progress Notes (Signed)
Pt family member, Tamera PuntMiranda, asking to speak with physician regarding pt's progress.  Family member adamant to speak with physician ASAP.  MD paged.  Sherald BargeSpencer, Jamyron Redd T

## 2014-07-28 NOTE — Progress Notes (Signed)
Upon entering room, pt had pulled panda tube out about a foot.  I reinserted and secured.  Mittens placed on patient.  MD outside room made aware and ordered cxr and DG abdomen.  Will continue to monitor. Sherald BargeSpencer, Kandi Brusseau T

## 2014-07-28 NOTE — Care Management Note (Signed)
Case Management Note  Patient Details  Name: Danny [name] MRN: [id number] Date of Birth: 1921-02-02  Subjective/Objective:                    Action/Plan: UR updated. MD meeting with family today to discuss plan of care . Will continue to follow .   Expected Discharge Date:                  Expected Discharge Plan:  Home w Home Health Services  In-House Referral:     Discharge planning Services     Post Acute Care Choice:    Choice offered to:     DME Arranged:    DME Agency:     HH Arranged:    HH Agency:     Status of Service:     Medicare Important Message Given:  Yes Date Medicare IM Given:  07/24/14 Medicare IM give by:  debbie dowell rn,bsn Date Additional Medicare IM Given:    Additional Medicare Important Message give by:     If discussed at Long Length of Stay Meetings, dates discussed:    Additional Comments:  Danny [name], Danny Sauseda Marie, RN 07/28/2014, 2:16 PM

## 2014-07-28 NOTE — Progress Notes (Signed)
Nutrition Follow-up  DOCUMENTATION CODES:  Non-severe (moderate) malnutrition in context of chronic illness  INTERVENTION:  Tube feeding (Vital 1.2 @ 55 ml/hr)  NUTRITION DIAGNOSIS:  Inadequate oral intake related to inability to eat as evidenced by NPO status.  Ongoing  GOAL:  Patient will meet greater than or equal to 90% of their needs  Goal met (with TF)  MONITOR:  Labs, Weight trends, TF tolerance, Skin, I & O's  REASON FOR ASSESSMENT:  Low Braden    ASSESSMENT: 81 yo male smoker with weakness, hypothermia, hypoglycemia, AKI, metabolic acidosis with respiratory alkalosis, and possible sepsis.  Pt transferred from ICU to surgical floor on 07/27/14.  Vital AF 1.2 formula infusing at 55 ml/hr via Panda tube providing 1584 kcals, 99 gm protein, 1071 ml of free water. Free water flushes at 200 ml every 6 hours. Regimen meets 100% of estimated nutritional needs.Tolerating well per RN.  Chart reviewed and discussed case with RN. Pt continues to have altered mentation and decline in status. Family meeting to take place this AM regarding goals of care. Palliative care continues to follow. Per RN, plan is to continue TF at this point.    Height:  Ht Readings from Last 1 Encounters:  07/27/2014 _0  (1.727 m)    Weight:  Wt Readings from Last 1 Encounters:  07/28/14 152 lb (68.947 kg)    Ideal Body Weight:  70 kg  Wt Readings from Last 10 Encounters:  07/28/14 152 lb (68.947 kg)  03/31/14 133 lb (60.328 kg)  03/12/14 126 lb (57.153 kg)  02/25/14 128 lb (58.06 kg)  05/11/13 128 lb 11.2 oz (58.378 kg)  11/25/12 132 lb (59.875 kg)  01/11/12 134 lb (60.782 kg)  01/10/11 145 lb 8 oz (65.998 kg)  12/06/10 147 lb (66.679 kg)  05/16/10 148 lb (67.132 kg)    BMI:  Body mass index is 23.12 kg/(m^2).  Estimated Nutritional Needs:  Kcal:  1500-1700  Protein:  75-85 grams1.5-1.7 L  Fluid:  1.5-1.7 L  Skin:  Reviewed, no issues  Diet Order:  Diet NPO time  specified Except for: Sips with Meds  EDUCATION NEEDS:  No education needs identified at this time   Intake/Output Summary (Last 24 hours) at 07/28/14 1208 Last data filed at 07/28/14 0800  Gross per 24 hour  Intake 2196.67 ml  Output    210 ml  Net 1986.67 ml    Last BM:  07/28/14  Danny [name] A. Jimmye Norman, RD, LDN, CDE Pager: [phone] After hours Pager: [phone]

## 2014-07-28 NOTE — Progress Notes (Signed)
Unfortunately, Danny [name] has not seemed to improve to the point I believe his family was hoping. I did touch base with daughter, Darnelle CatalanMalinda, via telephone who expresses interest in sitting down with us again with her family to figure out the next step. She said that she has spoken with Dr. Roda ShuttersXu and she has explained everything very well and "we know where we need to go now." Darnelle CatalanMalinda will call back to let us know a good time to meet with her and her family and I believe it would be very appropriate to consider comfort measures at this time. Will await call back.  Yong ChannelAlicia Yarissa Reining, NP Palliative Medicine Team Pager # [phone] (M-F 8a-5p) Team Phone # [phone] (Nights/Weekends)

## 2014-07-28 NOTE — Progress Notes (Signed)
Tube feeding stopped per MD verbal order Roda Shutters(Xu, Parke PoissonFang) and will resume when proper placement is verified.  Will continue to monitor. Sherald BargeSpencer, Charitie Hinote T

## 2014-07-29 LAB — COMPREHENSIVE METABOLIC PANEL
ALT: 36 U/L (ref 17–63)
AST: 35 U/L (ref 15–41)
Albumin: 2 g/dL — ABNORMAL LOW (ref 3.5–5.0)
Alkaline Phosphatase: 97 U/L (ref 38–126)
Anion gap: 9 (ref 5–15)
BUN: 39 mg/dL — ABNORMAL HIGH (ref 6–20)
CALCIUM: 8.1 mg/dL — AB (ref 8.9–10.3)
CO2: 22 mmol/L (ref 22–32)
CREATININE: 1.12 mg/dL (ref 0.61–1.24)
Chloride: 109 mmol/L (ref 101–111)
GFR calc non Af Amer: 55 mL/min — ABNORMAL LOW (ref >60)
GLUCOSE: 111 mg/dL — AB (ref 65–99)
Potassium: 3.6 mmol/L (ref 3.5–5.1)
SODIUM: 140 mmol/L (ref 135–145)
TOTAL PROTEIN: 5.3 g/dL — AB (ref 6.5–8.1)
Total Bilirubin: 1.7 mg/dL — ABNORMAL HIGH (ref 0.3–1.2)

## 2014-07-29 LAB — OCCULT BLOOD X 1 CARD TO LAB, STOOL: Fecal Occult Bld: POSITIVE — AB

## 2014-07-29 LAB — CBC
HCT: 33 % — ABNORMAL LOW (ref 39.0–52.0)
HEMOGLOBIN: 10.4 g/dL — AB (ref 13.0–17.0)
MCH: 29.6 pg (ref 26.0–34.0)
MCHC: 31.5 g/dL (ref 30.0–36.0)
MCV: 94 fL (ref 78.0–100.0)
Platelets: 131 10*3/uL — ABNORMAL LOW (ref 150–400)
RBC: 3.51 MIL/uL — AB (ref 4.22–5.81)
RDW: 18.8 % — ABNORMAL HIGH (ref 11.5–15.5)
WBC: 12.2 10*3/uL — ABNORMAL HIGH (ref 4.0–10.5)

## 2014-07-29 LAB — GLUCOSE, CAPILLARY
GLUCOSE-CAPILLARY: 110 mg/dL — AB (ref 65–99)
GLUCOSE-CAPILLARY: 126 mg/dL — AB (ref 65–99)
GLUCOSE-CAPILLARY: 138 mg/dL — AB (ref 65–99)
Glucose-Capillary: 105 mg/dL — ABNORMAL HIGH (ref 65–99)
Glucose-Capillary: 117 mg/dL — ABNORMAL HIGH (ref 65–99)
Glucose-Capillary: 127 mg/dL — ABNORMAL HIGH (ref 65–99)
Glucose-Capillary: 132 mg/dL — ABNORMAL HIGH (ref 65–99)
Glucose-Capillary: 159 mg/dL — ABNORMAL HIGH (ref 65–99)

## 2014-07-29 LAB — LIPASE, BLOOD: Lipase: 28 U/L (ref 22–51)

## 2014-07-29 MED ORDER — FUROSEMIDE 10 MG/ML IJ SOLN
20.0000 mg | Freq: Once | INTRAMUSCULAR | Status: AC
  Administered 2014-07-29: 20 mg via INTRAVENOUS
  Filled 2014-07-29: qty 2

## 2014-07-29 NOTE — Clinical Social Work Note (Signed)
CSW to continue to follow patient to determine what discharge plans are.  Ervin KnackEric R. Amare Bail, MSW, Theresia MajorsLCSWA [phone] 07/29/2014 5:50 PM

## 2014-07-29 NOTE — Progress Notes (Signed)
PROGRESS NOTE  Danny [name]:[id number] DOB: 06/10/1920 DOA: 07/20/2014 PCP: Romero Belling, MD  HPI: 81 yo male was feeling weak. His family noted he had slurred speech and Rt facial droop. He was seen in ER with concern for CVA. He was noted to be hypothermic and blood sugar was 17. He also had AKI, metabolic acidosis with elevated lactic acid, and hypotension. He was seen by stroke service. He was started on Abx, and given dextrose. He was started on warming blanket. He was admitted to SDU. He developed agitation and received several doses of ativan, morphine, and haldol.  STUDIES / EVENTS:  5/7 admitted to [facility] 5/07 CT head >> chronic cerebellum and Lt thalamus infarct 5/07 Echo >> EF 20-25%, moD AS/AI, RVSP 65 5/07 Renal u/s >> Markedly thickened gallbladder wall to 14 mm thick with edema/striations within wall without gallstones ; findings are concerning for acalculus cholecystitis 5/9 transfer to ICU for precedex for agitation 5/11 NSVT 5/18 TRH pick up  Subjective / 24 H Interval events - patient confused, no specific complaints, drifts back asleep easily  Assessment/Plan: Principal Problem:   Metabolic encephalopathy Active Problems:   CORONARY ATHEROSCLEROSIS, NATIVE VESSEL   Atrial fibrillation   Elevated troponin   Systolic CHF, chronic   Memory loss   Hypoglycemia   Hypothermia   AKI (acute kidney injury)   Renal failure, acute on chronic   Altered mental status   Malnutrition of moderate degree   Severe sepsis   Palliative care encounter    Metabolic encephalopathy/delirium: - Currently with mitten on, was on precedex drip in the icu, transferred to [facility] on 5/17, now off all sedatives,  - still confused, on feeding tube since 5/11.  - patient without significant improvement, appreciate palliative consult regarding goals of care.  - discussed with Yong Channel NP from Palliative over the phone today   Hypernatremia:  - resolving, continue  free water flush per feeding tube  Acute of chronic systolic CHF,  - EF 20-25% (07/19/2014), cxr persistent bilateral infiltrate, currently on room air, does have intermittent tachypnea,  - lasix  iv x1 on 5/17,  - repeat low dose lasix 5/18  PAF, h/o bradycardia, - currently sinus. On no nodal blocking agents, history of bradycardia - on Aspirin, not candidate for anticoagulation  HTN:  - will start lisinopril if renal function stable after today;s Lasix - BP normal today   ARF  - cr normalized on 5/17. Continue monitor cr with restart of lasix.  Elevation of lft:  - ? Cholecystitis, not a candidate for surgery, treated with zosyn x7days, now off all abx,  - ALT back to normal range on 5/13 - Tbili/ast still elevated, stable  NIDDM2 - a1c 6.7 on SSI here.  COPD  - currently no wheezing.   Diet: Diet NPO time specified Except for: Sips with Meds Fluids: none DVT Prophylaxis: SCD  Code Status: DNR Family Communication: none bedside  Disposition Plan: remain inpatient, pending GOC discussions with palliative consult  Consultants:  PCCM  Palliative  Procedures:  None    Antibiotics Zosyn 5/08 >> 5/14   Studies  Ct Head Wo Contrast  07/27/2014   CLINICAL DATA:  Altered mental status, history type 2 diabetes, smoking, hypertension, coronary artery disease, COPD  EXAM: CT HEAD WITHOUT CONTRAST  TECHNIQUE: Contiguous axial images were obtained from the base of the skull through the vertex without intravenous contrast.  COMPARISON:  08/03/2014  FINDINGS: RIGHT optic globe prosthesis.  Generalized atrophy.  Normal ventricular morphology.  No midline shift or mass effect.  Brain parenchyma otherwise normal appearance.  No intracranial hemorrhage, mass lesion, or evidence acute infarction.  No extra-axial fluid collections.  Bones and sinuses unremarkable.  IMPRESSION: Generalized atrophy.  No acute intracranial abnormalities.   Electronically Signed   By: Ulyses SouthwardMark  Boles  M.D.   On: 07/27/2014 21:21   Dg Chest Port 1 View  07/28/2014   CLINICAL DATA:  Status post feeding catheter placement  EXAM: PORTABLE CHEST - 1 VIEW  COMPARISON:  07/27/2014  FINDINGS: Feeding catheter is noted extending towards stomach. Cardiac shadow is stable. Left pleural effusion and retrocardiac consolidation is again identified. Diffuse vascular congestion is again seen. No bony abnormality is noted.  IMPRESSION: Vascular congestion with pulmonary edema as well as left lower lobe consolidation and left-sided pleural effusion.  Feeding tube extending into the stomach.   Electronically Signed   By: Alcide CleverMark  Lukens M.D.   On: 07/28/2014 15:44   Dg Abd Portable 1v  07/28/2014   CLINICAL DATA:  NG tube placement.  EXAM: PORTABLE ABDOMEN - 1 VIEW  COMPARISON:  07/26/2014  FINDINGS: Enteric tube is present with tip over the stomach in the left upper quadrant. Bowel gas pattern is nonobstructive. Persistent left base opacification. Remainder the exam is unchanged.  IMPRESSION: Nonobstructive bowel gas pattern.  Enteric tube with tip over the stomach in the left upper quadrant.   Electronically Signed   By: Elberta Fortisaniel  Boyle M.D.   On: 07/28/2014 15:35    Objective  Filed Vitals:   07/28/14 1706 07/28/14 2038 07/28/14 2119 07/29/14 0641  BP: 143/89  142/55 131/83  Pulse: 87 84 86 83  Temp:   98 F (36.7 C) 98 F (36.7 C)  TempSrc:   Axillary Axillary  Resp:  20 21 20   Height:      Weight:    67.042 kg (147 lb 12.8 oz)  SpO2:   91% 94%    Intake/Output Summary (Last 24 hours) at 07/29/14 0724 Last data filed at 07/29/14 0641  Gross per 24 hour  Intake      0 ml  Output   1550 ml  Net  -1550 ml   Filed Weights   07/27/14 0500 07/28/14 0500 07/29/14 0641  Weight: 63.2 kg (139 lb 5.3 oz) 68.947 kg (152 lb) 67.042 kg (147 lb 12.8 oz)    Exam:  General:  Sleeping, wakes up but drifts back asleep  HEENT: no scleral icterus  Cardiovascular: RRR without MRG, 2+ peripheral  pulses  Respiratory: coarse breath sounds anterior   Abdomen: soft, non tender, BS +  MSK/Extremities: no clubbing/cyanosis, no joint swelling  Skin: no rashes  Neuro: lethargic   Data Reviewed: Basic Metabolic Panel:  Recent Labs Lab 07/22/14 1833  07/24/14 1001 07/25/14 0256 07/26/14 0324 07/27/14 0204 07/28/14 0420  NA 149*  < > 148* 146* 152* 153* 143  K 4.0  < > 2.9* 3.1* 4.0 3.9 4.0  CL 117*  < > 118* 115* 120* 121* 113*  CO2 21*  < > 21* 21* 22 24 22   GLUCOSE 145*  < > 248* 289* 218* 182* 222*  BUN 93*  < > 89* 80* 62* 49* 43*  CREATININE 2.29*  < > 2.08* 1.78* 1.52* 1.27* 1.10  CALCIUM 8.8*  < > 8.3* 8.3* 8.5* 8.6* 8.4*  MG 2.5*  --   --   --   --   --  1.9  PHOS  --   --   --   --   --   --  2.8  < > = values in this interval not displayed. Liver Function Tests:  Recent Labs Lab 07/23/14 0210 07/24/14 1001 07/25/14 0256  AST 98* 57* 46*  ALT 78* 60 53  ALKPHOS 51 73 77  BILITOT 2.4* 1.9* 1.6*  PROT 5.7* 5.2* 5.4*  ALBUMIN 2.3* 2.0* 2.0*   CBC:  Recent Labs Lab 07/23/14 0210 07/24/14 1001 07/25/14 0256 07/28/14 0420  WBC 12.9* 13.1* 12.5* 11.5*  HGB 11.8* 11.0* 10.8* 10.1*  HCT 36.0* 34.4* 33.0* 32.6*  MCV 92.5 91.5 91.2 94.8  PLT 42* 42* 46* 123*   BNP (last 3 results)  Recent Labs  Jul 10, 2014 1944  BNP >4500.0*   CBG:  Recent Labs Lab 07/28/14 1217 07/28/14 1708 07/28/14 2005 07/29/14 0009 07/29/14 0336  GLUCAP 211* 165* 110* 138* 127*    Recent Results (from the past 240 hour(s))  Clostridium Difficile by PCR     Status: None   Collection Time: 07/24/14 11:00 PM  Result Value Ref Range Status   C difficile by pcr NEGATIVE NEGATIVE Final     Scheduled Meds: . antiseptic oral rinse  7 mL Mouth Rinse q12n4p  . arformoterol  15 mcg Nebulization BID  . aspirin  81 mg Per Tube Daily  . budesonide (PULMICORT) nebulizer solution  0.25 mg Nebulization BID  . chlorhexidine  15 mL Mouth Rinse BID  . donepezil  10 mg Oral QHS   . free water  250 mL Per Tube 6 times per day  . insulin aspart  0-20 Units Subcutaneous 6 times per day  . lisinopril  5 mg Per Tube Daily  . metoprolol tartrate  12.5 mg Per Tube BID  . pantoprazole (PROTONIX) IV  40 mg Intravenous Q24H   Continuous Infusions: . feeding supplement (VITAL AF 1.2 CAL) 1,000 mL (07/29/14 [id number])   Time spent: 25 minutes  Pamella Pertostin Lavanya Roa, MD [facility] Pager [phone]. If 7 PM - 7 AM, please contact night-coverage at [email or web address], password [facility] 07/29/2014, 7:24 AM  LOS: 11 days

## 2014-07-30 LAB — BASIC METABOLIC PANEL
Anion gap: 9 (ref 5–15)
BUN: 38 mg/dL — ABNORMAL HIGH (ref 6–20)
CO2: 25 mmol/L (ref 22–32)
Calcium: 8.4 mg/dL — ABNORMAL LOW (ref 8.9–10.3)
Chloride: 106 mmol/L (ref 101–111)
Creatinine, Ser: 1.09 mg/dL (ref 0.61–1.24)
GFR, EST NON AFRICAN AMERICAN: 57 mL/min — AB (ref >60)
Glucose, Bld: 137 mg/dL — ABNORMAL HIGH (ref 65–99)
POTASSIUM: 4.2 mmol/L (ref 3.5–5.1)
Sodium: 140 mmol/L (ref 135–145)

## 2014-07-30 LAB — GLUCOSE, CAPILLARY
GLUCOSE-CAPILLARY: 154 mg/dL — AB (ref 65–99)
GLUCOSE-CAPILLARY: 145 mg/dL — AB (ref 65–99)
GLUCOSE-CAPILLARY: 113 mg/dL — AB (ref 65–99)
Glucose-Capillary: 120 mg/dL — ABNORMAL HIGH (ref 65–99)
Glucose-Capillary: 131 mg/dL — ABNORMAL HIGH (ref 65–99)
Glucose-Capillary: 122 mg/dL — ABNORMAL HIGH (ref 65–99)

## 2014-07-30 MED ORDER — FUROSEMIDE 10 MG/ML IJ SOLN
20.0000 mg | Freq: Once | INTRAMUSCULAR | Status: AC
  Administered 2014-07-30: 20 mg via INTRAVENOUS
  Filled 2014-07-30: qty 2

## 2014-07-30 NOTE — Progress Notes (Signed)
PROGRESS NOTE  Danny [name] JYN:[id number] DOB: Sep 25, 1920 DOA: 08/05/2014 PCP: Romero BellingELLISON, SEAN, MD  HPI: 81 yo male was feeling weak. His family noted he had slurred speech and Rt facial droop. He was seen in ER with concern for CVA. He was noted to be hypothermic and blood sugar was 17. He also had AKI, metabolic acidosis with elevated lactic acid, and hypotension. He was seen by stroke service. He was started on Abx, and given dextrose. He was started on warming blanket. He was admitted to SDU. He developed agitation and received several doses of ativan, morphine, and haldol.  STUDIES / EVENTS:  [facility] >> chronic cerebellum and Lt thalamus infarct 5/07 Echo >> EF 20-25%, moD AS/AI, RVSP 65 5/07 Renal u/s >> Markedly thickened gallbladder wall to 14 mm thick with edema/striations within wall without gallstones ; findings are concerning for acalculus cholecystitis 5/9 transfer to ICU for precedex for agitation 5/11 NSVT 5/18 TRH pick up  Subjective / 24 H Interval events - patient confused, no specific complaints, drifts back asleep easily  Assessment/Plan: Principal Problem:   Metabolic encephalopathy Active Problems:   CORONARY ATHEROSCLEROSIS, NATIVE VESSEL   Atrial fibrillation   Elevated troponin   Systolic CHF, chronic   Memory loss   Hypoglycemia   Hypothermia   AKI (acute kidney injury)   Renal failure, acute on chronic   Altered mental status   Malnutrition of moderate degree   Severe sepsis   Palliative care encounter   Metabolic encephalopathy/delirium: - was on precedex drip in the icu, [facility], now off all sedatives,  - still confused, on feeding tube since 5/11.  - patient without significant improvement, appreciate palliative consult regarding goals of care.  - discussed with Yong ChannelAlicia Parker NP from Palliative over the phone - SLP re-consulted today, continue to keep NPO, they felt like patient made no  progress  Goals of care - will meet with family and palliative team tomorrow at 4:30 pm  Hypernatremia:  - resolving, continue free water flush per feeding tube  Acute of chronic systolic CHF,  - EF 20-25% (07/19/2014), cxr persistent bilateral infiltrate, currently on room air, does have intermittent tachypnea,  - lasix 40mg  iv x1 on 5/17,  - repeat low dose lasix 5/18 - weight up 2 lb today, continue Lasix.  PAF, h/o bradycardia, - currently sinus. On no nodal blocking agents, history of bradycardia - on Aspirin, not candidate for anticoagulation  HTN:  - BP normal today   ARF  - cr normalized on 5/17. Continue monitor cr with restart of lasix.  Elevation of lft:  - ? Cholecystitis, not a candidate for surgery, treated with zosyn x7days, now off all abx,  - ALT back to normal range on 5/13 - Tbili/ast still elevated, stable  NIDDM2 - a1c 6.7 on SSI here.  COPD  - currently no wheezing.   Diet: Diet NPO time specified Except for: Sips with Meds Fluids: none DVT Prophylaxis: SCD  Code Status: DNR Family Communication: none bedside  Disposition Plan: remain inpatient, pending GOC discussions with palliative consult  Consultants:  PCCM  Palliative  Procedures:  None    Antibiotics Zosyn 5/08 >> 5/14   Studies  Dg Chest Port 1 View  07/28/2014   CLINICAL DATA:  Status post feeding catheter placement  EXAM: PORTABLE CHEST - 1 VIEW  COMPARISON:  07/27/2014  FINDINGS: Feeding catheter is noted extending towards stomach. Cardiac shadow is stable. Left  pleural effusion and retrocardiac consolidation is again identified. Diffuse vascular congestion is again seen. No bony abnormality is noted.  IMPRESSION: Vascular congestion with pulmonary edema as well as left lower lobe consolidation and left-sided pleural effusion.  Feeding tube extending into the stomach.   Electronically Signed   By: Alcide Clever M.D.   On: 07/28/2014 15:44   Dg Abd Portable 1v  07/28/2014    CLINICAL DATA:  NG tube placement.  EXAM: PORTABLE ABDOMEN - 1 VIEW  COMPARISON:  07/26/2014  FINDINGS: Enteric tube is present with tip over the stomach in the left upper quadrant. Bowel gas pattern is nonobstructive. Persistent left base opacification. Remainder the exam is unchanged.  IMPRESSION: Nonobstructive bowel gas pattern.  Enteric tube with tip over the stomach in the left upper quadrant.   Electronically Signed   By: Elberta Fortis M.D.   On: 07/28/2014 15:35    Objective  Filed Vitals:   07/29/14 1500 07/29/14 2201 07/29/14 2208 07/30/14 0515  BP: 125/57  114/69 139/101  Pulse: 101  74   Temp: 97.4 F (36.3 C)  98.1 F (36.7 C) 97.7 F (36.5 C)  TempSrc: Axillary  Axillary Axillary  Resp: Height:      Weight:    67.903 kg (149 lb 11.2 oz)  SpO2: 99% 98% 99% 95%    Intake/Output Summary (Last 24 hours) at 07/30/14 0735 Last data filed at 07/30/14 0600  Gross per 24 hour  Intake   2015 ml  Output   1680 ml  Net    335 ml   Filed Weights   07/28/14 0500 07/29/14 0641 07/30/14 0515  Weight: 68.947 kg (152 lb) 67.042 kg (147 lb 12.8 oz) 67.903 kg (149 lb 11.2 oz)    Exam:  General:  Sleeping, wakes up but drifts back asleep  HEENT: no scleral icterus  Cardiovascular: RRR without MRG, 2+ peripheral pulses  Respiratory: coarse breath sounds anterior   Abdomen: soft, non tender, BS +  MSK/Extremities: no clubbing/cyanosis, no joint swelling  Skin: no rashes  Neuro: lethargic   Data Reviewed: Basic Metabolic Panel:  Recent Labs Lab 07/25/14 0256 07/26/14 0324 07/27/14 0204 07/28/14 0420 07/29/14 0835  NA 146* 152* 153* 143 140  K 3.1* 4.0 3.9 4.0 3.6  CL 115* 120* 121* 113* 109  CO2 21* GLUCOSE 289* 218* 182* 222* 111*  BUN 80* 62* 49* 43* 39*  CREATININE 1.78* 1.52* 1.27* 1.10 1.12  CALCIUM 8.3* 8.5* 8.6* 8.4* 8.1*  MG  --   --   --  1.9  --   PHOS  --   --   --  2.8  --    Liver Function Tests:  Recent Labs Lab  07/24/14 1001 07/25/14 0256 07/29/14 0835  AST 57* 46* 35  ALT 60 53 36  ALKPHOS 73 77 97  BILITOT 1.9* 1.6* 1.7*  PROT 5.2* 5.4* 5.3*  ALBUMIN 2.0* 2.0* 2.0*   CBC:  Recent Labs Lab 07/24/14 1001 07/25/14 0256 07/28/14 0420 07/29/14 0835  WBC 13.1* 12.5* 11.5* 12.2*  HGB 11.0* 10.8* 10.1* 10.4*  HCT 34.4* 33.0* 32.6* 33.0*  MCV 91.5 91.2 94.8 94.0  PLT 42* 46* 123* 131*   BNP (last 3 results)  Recent Labs  08/09/2014 1944  BNP >4500.0*   CBG:  Recent Labs Lab 07/29/14 1401 07/29/14 1649 07/29/14 1946 07/29/14 2342 07/30/14 0331  GLUCAP 159* 126* 117* 132* 120*    Recent Results (  from the past 240 hour(s))  Clostridium Difficile by PCR     Status: None   Collection Time: 07/24/14 11:00 PM  Result Value Ref Range Status   C difficile by pcr NEGATIVE NEGATIVE Final     Scheduled Meds: . antiseptic oral rinse  7 mL Mouth Rinse q12n4p  . arformoterol  15 mcg Nebulization BID  . aspirin  81 mg Per Tube Daily  . budesonide (PULMICORT) nebulizer solution  0.25 mg Nebulization BID  . chlorhexidine  15 mL Mouth Rinse BID  . donepezil  10 mg Oral QHS  . free water  250 mL Per Tube 6 times per day  . insulin aspart  0-20 Units Subcutaneous 6 times per day  . lisinopril  5 mg Per Tube Daily  . metoprolol tartrate  12.5 mg Per Tube BID  . pantoprazole (PROTONIX) IV  40 mg Intravenous Q24H   Continuous Infusions: . feeding supplement (VITAL AF 1.2 CAL) 1,000 mL (07/30/14 0453)    Danny Pertostin Mikyah Alamo, MD [facility] Pager [phone]. If 7 PM - 7 AM, please contact night-coverage at [email or web address], password [email or web address] 07/30/2014, 7:35 AM  LOS: 12 days

## 2014-07-30 NOTE — Evaluation (Signed)
Clinical/Bedside Swallow Evaluation Patient Details  Name: Derinda SisJesse L Lemma MRN: [id number] Date of Birth: 10/20/1920  Today's Date: 07/30/2014 Time: SLP Start Time (ACUTE ONLY): 0947 SLP Stop Time (ACUTE ONLY): 1008 SLP Time Calculation (min) (ACUTE ONLY): 21 min  Past Medical History:  Past Medical History  Diagnosis Date  . DIABETES MELLITUS, TYPE II 12/06/2006  . NEPHROPATHY, DIABETIC 12/06/2006  . HYPERLIPIDEMIA 12/06/2006  . HYPOKALEMIA 01/12/2009  . TOBACCO USER 05/13/2009  . HYPERTENSION 12/06/2006  . CORONARY ATHEROSCLEROSIS, NATIVE VESSEL 05/13/2009  . Atrial fibrillation 05/04/2009  . COPD 12/06/2006  . GERD 12/06/2006  . DIVERTICULOSIS, COLON 12/06/2006  . CONSTIPATION 12/20/2007  . RECTAL BLEEDING 12/20/2007  . RENAL INSUFFICIENCY 12/20/2007  . BACK PAIN, LUMBAR 01/21/2008  . DIZZINESS OR VERTIGO 12/06/2006  . ABNORMAL EKG 05/13/2009  . Diastolic dysfunction   . Blindness of right eye    Past Surgical History:  Past Surgical History  Procedure Laterality Date  . Eye surgery Bilateral     cataratcs   HPI:  81 year old male admitted with slurred speech and right facial droop. Seen in ER with concern for CVA. CT head noted chronic cerebellum and left thalamic infarct. Also wtih AKI, metabolic acidosis, and hypotension. Developled agitation requiring sedatives and transferred to the ICU.    Assessment / Plan / Recommendation Clinical Impression  Dysphagia appears primarily cognitively based at this time with poor alertness and awareness impacting ability to efficiently and safely consume pos. With maximum verbal and tactile cueing, patient able to orally accept bolus however with significantly oral holding, oral transit delays, requiring oral suctioning in approximately 50% of boluses. Once swallow initiated, patient without overt evidence of aspiration although hyolaryngeal movement audible and sluggish suggestive of a pharyngeal component. Recommend continued NPO with SLP f/u to  assess readiness for po and/or instrumental testing. Pending family decisions regarding comfort measures, it may be appropriate to liberalize diet without additional intervention. Will f/u.     Aspiration Risk  Severe    Diet Recommendation NPO   Medication Administration: Via alternative means    Other  Recommendations Oral Care Recommendations: Oral care QID   Follow Up Recommendations       Frequency and Duration min 2x/week  2 weeks   Pertinent Vitals/Pain n/a        Swallow Study    General Other Pertinent Information: 81 year old male admitted with slurred speech and right facial droop. Seen in ER with concern for CVA. CT head noted chronic cerebellum and left thalamic infarct. Also wtih AKI, metabolic acidosis, and hypotension. Developled agitation requiring sedatives and transferred to the ICU.  Type of Study: Bedside swallow evaluation Previous Swallow Assessment: none Diet Prior to this Study: NPO;Panda Temperature Spikes Noted: No Respiratory Status: Room air History of Recent Intubation: No Behavior/Cognition: Confused;Requires cueing Oral Cavity - Dentition: Edentulous Self-Feeding Abilities: Total assist Patient Positioning: Upright in bed Baseline Vocal Quality: Low vocal intensity Volitional Cough: Cognitively unable to elicit Volitional Swallow: Unable to elicit    Oral/Motor/Sensory Function Overall Oral Motor/Sensory Function:  (unable to formally assess due to AMS, appears functional)   Ice Chips Ice chips: Impaired Presentation: Spoon Oral Phase Impairments: Impaired anterior to posterior transit;Poor awareness of bolus Oral Phase Functional Implications: Prolonged oral transit;Oral holding;Oral residue Pharyngeal Phase Impairments: Suspected delayed Swallow (audible, sluggish hyolaryngeal elevation/excursion)   Thin Liquid Thin Liquid: Impaired Presentation: Straw;Spoon Oral Phase Impairments: Poor awareness of bolus;Impaired anterior to posterior  transit Oral Phase Functional Implications: Oral holding;Prolonged oral  transit;Oral residue Pharyngeal  Phase Impairments: Suspected delayed Swallow;Decreased hyoid-laryngeal movement;Wet Vocal Quality (audible, sluggish hyolaryngeal elevation/excursion)    Nectar Thick Nectar Thick Liquid: Not tested   Honey Thick Honey Thick Liquid: Not tested   Puree Puree: Impaired Presentation: Spoon Oral Phase Impairments: Impaired anterior to posterior transit;Poor awareness of bolus Oral Phase Functional Implications: Oral holding (required suctioning of oral cavity)   Solid   GO   Trafton Roker MA, CCC-SLP [phone]  Solid: Not tested       Wendie Diskin Meryl 07/30/2014,10:13 AM

## 2014-07-30 NOTE — Care Management Note (Signed)
Case Management Note  Patient Details  Name: Danny [name] MRN: [id number] Date of Birth: 29-Nov-1920  Subjective/Objective:                    Action/Plan: Awaiting Family decision on discharge plan  Expected Discharge Date:                  Expected Discharge Plan:  Skilled Nursing Facility  In-House Referral:  Clinical Social Work  Discharge planning Services     Post Acute Care Choice:    Choice offered to:     DME Arranged:    DME Agency:     HH Arranged:    HH Agency:     Status of Service:  In process, will continue to follow  Medicare Important Message Given:  Yes Date Medicare IM Given:  07/24/14 Medicare IM give by:  debbie dowell rn,bsn Date Additional Medicare IM Given:  07/30/14 Additional Medicare Important Message give by:  Wallace KellerHeather WileRN  If discussed at Long Length of Stay Meetings, dates discussed:  07-30-14  Additional Comments:  Danny [name], Danny Standish Marie, RN 07/30/2014, 12:47 PM

## 2014-07-30 NOTE — Progress Notes (Signed)
Physical Therapy Treatment Patient Details Name: Danny [name] MRN: [id number] DOB: Mar 06, 1921 Today's Date: 07/30/2014    History of Present Illness Adm 5/7 with AMS, sepsis; AKI; periods of extreme confusion/agitation alternating with sedation PMHx- COPD, CHF with EF 25%, DM, blind Rt eye (?prosthetic eye)    PT Comments    Patient demonstrates no significant progress towards goals at this time. Patient required maximal assist for all aspects of mobility. +2 assist for functional tasks. Continue to await GOC for update regarding POC. Will follow as indicated,   Follow Up Recommendations  SNF;Supervision/Assistance - 24 hour     Equipment Recommendations  Other (comment) (TBA)    Recommendations for Other Services      Precautions / Restrictions Precautions Precautions: Fall Restrictions Weight Bearing Restrictions: No    Mobility  Bed Mobility Overal bed mobility: Needs Assistance Bed Mobility: Supine to Sit;Sit to Supine     Supine to sit: Max assist;HOB elevated Sit to supine: Max assist   General bed mobility comments: Patient with minimal effort to reach with UE, max assist to elevate trunk and rotate to EOB  Transfers                 General transfer comment: unable   Ambulation/Gait                 Stairs            Wheelchair Mobility    Modified Rankin (Stroke Patients Only)       Balance Overall balance assessment: Needs assistance Sitting-balance support: Feet supported Sitting balance-Leahy Scale: Zero Sitting balance - Comments: Max assist for seated balance, demonstrates no functional ability to maintain balance at EOB                            Cognition Arousal/Alertness: Lethargic Behavior During Therapy: Flat affect Overall Cognitive Status: Impaired/Different from baseline Area of Impairment: Orientation;Attention;Following commands;Awareness Orientation Level: Place;Time;Situation Current  Attention Level: Sustained (barely above focused)   Following Commands: Follows one step commands inconsistently;Follows one step commands with increased time   Awareness:  (pre-intellectual)   General Comments: able to state name, agreeable to EOB. Patient able to follow one step simple commands with increased time and increased VCs.     Exercises      General Comments        Pertinent Vitals/Pain Pain Assessment: Faces Faces Pain Scale: No hurt    Home Living                      Prior Function            PT Goals (current goals can now be found in the care plan section) Acute Rehab PT Goals Patient Stated Goal: unable to state PT Goal Formulation: Patient unable to participate in goal setting Time For Goal Achievement: 08/07/14 Potential to Achieve Goals: Fair Progress towards PT goals: Not progressing toward goals - comment    Frequency  Min 2X/week    PT Plan Current plan remains appropriate    Co-evaluation             End of Session   Activity Tolerance: Patient limited by lethargy Patient left: in bed;with call bell/phone within reach;with bed alarm set;with SCD's reapplied     Time: [id number] PT Time Calculation (min) (ACUTE ONLY): 20 min  Charges:  $Therapeutic Activity: 8-22 mins  G CodesFabio Asa:      Deonte Otting J 07/30/2014, 9:48 AM Charlotte Crumbevon Lillianna Sabel, PT DPT  [phone]

## 2014-07-30 NOTE — Progress Notes (Signed)
I spoke with daughter, Darnelle CatalanMalinda, and confirmed with Dr. Lafe GarinGherge and we will meet tomorrow, Friday 5/20 at 1615.   Yong ChannelAlicia Ytzel Gubler, NP Palliative Medicine Team Pager # [phone] (M-F 8a-5p) Team Phone # [phone] (Nights/Weekends)

## 2014-07-31 ENCOUNTER — Inpatient Hospital Stay (HOSPITAL_COMMUNITY): Payer: Medicare Other

## 2014-07-31 DIAGNOSIS — Z7189 Other specified counseling: Secondary | ICD-10-CM

## 2014-07-31 LAB — BASIC METABOLIC PANEL
ANION GAP: 9 (ref 5–15)
BUN: 41 mg/dL — AB (ref 6–20)
CALCIUM: 8.3 mg/dL — AB (ref 8.9–10.3)
CO2: 23 mmol/L (ref 22–32)
CREATININE: 0.96 mg/dL (ref 0.61–1.24)
Chloride: 107 mmol/L (ref 101–111)
GFR calc Af Amer: >60 mL/min (ref >60)
GFR calc non Af Amer: >60 mL/min (ref >60)
GLUCOSE: 139 mg/dL — AB (ref 65–99)
Potassium: 3.9 mmol/L (ref 3.5–5.1)
Sodium: 139 mmol/L (ref 135–145)

## 2014-07-31 LAB — GLUCOSE, CAPILLARY
GLUCOSE-CAPILLARY: 131 mg/dL — AB (ref 65–99)
Glucose-Capillary: 127 mg/dL — ABNORMAL HIGH (ref 65–99)
Glucose-Capillary: 119 mg/dL — ABNORMAL HIGH (ref 65–99)
Glucose-Capillary: 146 mg/dL — ABNORMAL HIGH (ref 65–99)
Glucose-Capillary: 122 mg/dL — ABNORMAL HIGH (ref 65–99)

## 2014-07-31 MED ORDER — MORPHINE SULFATE (CONCENTRATE) 10 MG/0.5ML PO SOLN
5.0000 mg | ORAL | Status: DC | PRN
  Administered 2014-07-31: 5 mg
  Filled 2014-07-31 (×2): qty 0.5

## 2014-07-31 NOTE — Progress Notes (Signed)
PROGRESS NOTE  Danny SisJesse L [name] ZOX:[id number] DOB: 01-25-1921 DOA: 07/25/2014 PCP: Romero BellingELLISON, SEAN, MD  HPI: 81 yo male was feeling weak. His family noted he had slurred speech and Rt facial droop. He was seen in ER with concern for CVA. He was noted to be hypothermic and blood sugar was 17. He also had AKI, metabolic acidosis with elevated lactic acid, and hypotension. He was seen by stroke service. He was started on Abx, and given dextrose. He was started on warming blanket. He was admitted to SDU. He developed agitation and received several doses of ativan, morphine, and haldol.  STUDIES / EVENTS:  [facility] >> chronic cerebellum and Lt thalamus infarct 5/07 Echo >> EF 20-25%, moD AS/AI, RVSP 65 5/07 Renal u/s >> Markedly thickened gallbladder wall to 14 mm thick with edema/striations within wall without gallstones ; findings are concerning for acalculus cholecystitis 5/9 transfer to ICU for precedex for agitation 5/11 NSVT 5/18 TRH pick up  Subjective / 24 H Interval events - patient confused, no specific complaints  Assessment/Plan: Principal Problem:   Metabolic encephalopathy Active Problems:   CORONARY ATHEROSCLEROSIS, NATIVE VESSEL   Atrial fibrillation   Elevated troponin   Systolic CHF, chronic   Memory loss   Hypoglycemia   Hypothermia   AKI (acute kidney injury)   Renal failure, acute on chronic   Altered mental status   Malnutrition of moderate degree   Severe sepsis   Palliative care encounter   Metabolic encephalopathy/delirium: - was on precedex drip in the icu, [facility] now off all sedatives,  - still confused, on feeding tube since 5/11.  - patient without significant improvement, appreciate palliative consult regarding goals of care.  - discussed with Yong ChannelAlicia Parker NP from Palliative over the phone - SLP re-consulted today, continue to keep NPO, they felt like patient made no progress  Goals of  care - extensive discussions with the patient's 6 children this afternoon. We discussed his poor prognosis, his inability to eat or participate in any form of physical therapy. He has multiple medical problems that while stable at this point are very fragile. They understand his prognosis however still have difficulties on deciding what to do about feeding. They agree that patient himself would have not wanted any feeding tube however they feel like if they discontinue feeding tubes he will die sooner. We discussed natural progression of dementia and ensuing inability to swallow even his own secretions resulting in aspiration regardless of the feeding tubes. Family will carry further conversations with palliative and amongst themselves.  Hypernatremia:  - resolving, continue free water flush per feeding tube  Acute of chronic systolic CHF,  - EF 20-25% (07/19/2014), cxr persistent bilateral infiltrate, currently on room air, does have intermittent tachypnea,  - lasix 40mg  iv x1 on 5/17,  - repeat low dose lasix 5/18 - weight up 2 lb today, continue Lasix.  PAF, h/o bradycardia, - currently sinus. On no nodal blocking agents, history of bradycardia - on Aspirin, not candidate for anticoagulation  HTN:  - BP normal today   ARF  - cr normalized on 5/17. Continue monitor cr with restart of lasix.  Elevation of lft:  - ? Cholecystitis, not a candidate for surgery, treated with zosyn x7days, now off all abx,  - ALT back to normal range on 5/13 - Tbili/ast still elevated, stable  NIDDM2 - a1c 6.7 on SSI here.  COPD  - currently no wheezing.  Diet: Diet NPO time specified Except for: Sips with Meds Fluids: none DVT Prophylaxis: SCD  Code Status: DNR Family Communication: none bedside  Disposition Plan: remain inpatient, pending GOC discussions with palliative consult  Consultants:  PCCM  Palliative  Procedures:  None    Antibiotics Zosyn 5/08 >> 5/14   Studies  No  results found.  Objective  Filed Vitals:   07/30/14 1500 07/30/14 2146 07/31/14 0533 07/31/14 0948  BP: 119/50 101/51 122/81 136/67  Pulse: 74 101  73  Temp: 98.2 F (36.8 C) 97.3 F (36.3 C) 98.6 F (37 C)   TempSrc: Axillary Oral Axillary   Resp: Height:      Weight:   66.9 kg (147 lb 7.8 oz)   SpO2: 93% 92% 93% 100%    Intake/Output Summary (Last 24 hours) at 07/31/14 1314 Last data filed at 07/31/14 0317  Gross per 24 hour  Intake      0 ml  Output   2050 ml  Net  -2050 ml   Filed Weights   07/29/14 0641 07/30/14 0515 07/31/14 0533  Weight: 67.042 kg (147 lb 12.8 oz) 67.903 kg (149 lb 11.2 oz) 66.9 kg (147 lb 7.8 oz)    Exam:  General:  Sleeping, wakes up but drifts back asleep  HEENT: no scleral icterus  Cardiovascular: RRR without MRG, 2+ peripheral pulses  Respiratory: coarse breath sounds anterior   Abdomen: soft, non tender, BS +  MSK/Extremities: no clubbing/cyanosis, no joint swelling  Skin: no rashes  Neuro: lethargic   Data Reviewed: Basic Metabolic Panel:  Recent Labs Lab 07/27/14 0204 07/28/14 0420 07/29/14 0835 07/30/14 1000 07/31/14 0415  NA 153* 143 140 140 139  K 3.9 4.0 3.6 4.2 3.9  CL 121* 113* 109 106 107  CO2 GLUCOSE 182* 222* 111* 137* 139*  BUN 49* 43* 39* 38* 41*  CREATININE 1.27* 1.10 1.12 1.09 0.96  CALCIUM 8.6* 8.4* 8.1* 8.4* 8.3*  MG  --  1.9  --   --   --   PHOS  --  2.8  --   --   --    Liver Function Tests:  Recent Labs Lab 07/25/14 0256 07/29/14 0835  AST 46* 35  ALT 53 36  ALKPHOS 77 97  BILITOT 1.6* 1.7*  PROT 5.4* 5.3*  ALBUMIN 2.0* 2.0*   CBC:  Recent Labs Lab 07/25/14 0256 07/28/14 0420 07/29/14 0835  WBC 12.5* 11.5* 12.2*  HGB 10.8* 10.1* 10.4*  HCT 33.0* 32.6* 33.0*  MCV 91.2 94.8 94.0  PLT 46* 123* 131*   BNP (last 3 results)  Recent Labs  07/21/2014 1944  BNP >4500.0*   CBG:  Recent Labs Lab 07/30/14 1932 07/30/14 2327 07/31/14 0314  07/31/14 0806 07/31/14 1224  GLUCAP 113* 122* 127* 119* 146*    Recent Results (from the past 240 hour(s))  Clostridium Difficile by PCR     Status: None   Collection Time: 07/24/14 11:00 PM  Result Value Ref Range Status   C difficile by pcr NEGATIVE NEGATIVE Final     Scheduled Meds: . antiseptic oral rinse  7 mL Mouth Rinse q12n4p  . arformoterol  15 mcg Nebulization BID  . aspirin  81 mg Per Tube Daily  . budesonide (PULMICORT) nebulizer solution  0.25 mg Nebulization BID  . chlorhexidine  15 mL Mouth Rinse BID  . donepezil  10 mg Oral QHS  . free water  250 mL Per  Tube 6 times per day  . insulin aspart  0-20 Units Subcutaneous 6 times per day  . lisinopril  5 mg Per Tube Daily  . metoprolol tartrate  12.5 mg Per Tube BID  . pantoprazole (PROTONIX) IV  40 mg Intravenous Q24H   Continuous Infusions: . feeding supplement (VITAL AF 1.2 CAL) 1,000 mL (07/31/14 0549)   Time spent: 45 minutes, more than 50% discussing with family regarding goals of care  Pamella Pertostin Avaiyah Strubel, MD [facility] Pager [phone]. If 7 PM - 7 AM, please contact night-coverage at [email or web address], password [email or web address] 07/31/2014, 1:14 PM  LOS: 13 days

## 2014-07-31 NOTE — Progress Notes (Signed)
Daily Progress Note   Patient Name: Danny [name]       Date: 07/31/2014 DOB: Sep 08, 1920  Age: 81 y.o. MRN#: [id number] Attending Physician: Caren Griffins, MD Primary Care Physician: Renato Shin, MD Admit Date: 08/07/2014  Reason for Consultation/Follow-up: Establishing goals of care  Met with daughters Marliss Czar, sons Simona Huh and his wife Wilford Sports and [employer]. Dr. Cruzita Lederer present for initial portion of meeting providing medical opinion Extensive education regarding disease progression r/t dementia particularly how it effects eating and drinking as it progresses. We also discussed disposition options now that pt has not returned to baseline as family had initially hoped. Large part of discussion centered around whether to contineu Panda feeds, PEG placement or comfort feeding. After much discussion family agreed to pull [place] and provide comfort foods. Disposition also discussed in that returning home is not an option in his current state. Family does not want to pursue SNF and wishes to pursue in-patient hospice facility.  Subjective: Pt is a 80 yo man with h/o systolic heart failure within EF 20%, COPD admitted after family observed him to have facial drooping, and increased confusion. Code Stroke was called. Found to have glucose of 17, hypothermic and ultimately felt that pt was septic from cholecystitis. Pt is not a surgical candidate. His hospitalization was complicated by acute delirium requiring precedex in ICU. He has stabilized medically but remains delirious, but not agitated. Unable to follow instructions to participate in either speech or PT. He has had a Panda Tube in for feeding  Tonight he is restless, confused. Unable to follow commands and appears to be in pain. He tells me his legs hurt. Interval Events: Transfer out of ICU. Off all sedating rx  Length of Stay: 13 days  Current Medications: Scheduled Meds:  . antiseptic oral rinse  7 mL Mouth Rinse q12n4p    . arformoterol  15 mcg Nebulization BID  . aspirin  81 mg Per Tube Daily  . budesonide (PULMICORT) nebulizer solution  0.25 mg Nebulization BID  . chlorhexidine  15 mL Mouth Rinse BID  . insulin aspart  0-20 Units Subcutaneous 6 times per day  . pantoprazole (PROTONIX) IV  40 mg Intravenous Q24H    Continuous Infusions: no    PRN Meds: acetaminophen, albuterol, morphine CONCENTRATE  Palliative Performance Scale: 10-20%%     Vital Signs: BP 131/88 mmHg  Pulse 65  Temp(Src) 98.1 F (36.7 C) (Axillary)  Resp 20  Ht _0  (1.727 m)  Wt 66.9 kg (147 lb 7.8 oz)  BMI 22.43 kg/m2  SpO2 94% SpO2: SpO2: 94 % O2 Device: O2 Device: Not Delivered O2 Flow Rate: O2 Flow Rate (L/min): 1 L/min  Intake/output summary:  Intake/Output Summary (Last 24 hours) at 07/31/14 1812 Last data filed at 07/31/14 1230  Gross per 24 hour  Intake      0 ml  Output   2200 ml  Net  -2200 ml   LBM:   Baseline Weight: Weight: 60.3 kg (132 lb 15 oz) Most recent weight: Weight: 66.9 kg (147 lb 7.8 oz)  Physical Exam: General: Cachetic elderly man,. Distressed Musculoskeletal: MAE x4 but not to command GU: Foley              Additional Data Reviewed: Recent Labs     07/29/14  0835  07/30/14  1000  07/31/14  0415  WBC  12.2*   --    --   HGB  10.4*   --    --  PLT  131*   --    --   NA  140  140  139  BUN  39*  38*  41*  CREATININE  1.12  1.09  0.96     Problem List:  Patient Active Problem List   Diagnosis Date Noted  . Severe sepsis 07/25/2014  . Palliative care encounter 07/25/2014  . Malnutrition of moderate degree 07/22/2014  . Renal failure, acute on chronic   . Altered mental status   . Hypoglycemia 07/28/2014  . Hypothermia 07/24/2014  . AKI (acute kidney injury) 08/09/2014  . Metabolic encephalopathy 72/82/0601  . Memory loss 03/31/2014  . Systolic CHF, chronic 56/15/3794  . Blurred vision 02/25/2014  . Elevated troponin 05/11/2013  . Pulmonary edema 05/11/2013  .  Routine general medical examination at a health care facility 01/14/2012  . Pyuria 01/14/2012  . Screening for prostate cancer 12/06/2010  . Encounter for long-term (current) use of other medications 12/06/2010  . BRONCHITIS, ACUTE 11/26/2009  . FEVER UNSPECIFIED 11/26/2009  . Tobacco use disorder 05/13/2009  . CORONARY ATHEROSCLEROSIS, NATIVE VESSEL 05/13/2009  . ABNORMAL EKG 05/13/2009  . Atrial fibrillation 05/04/2009  . HYPOKALEMIA 01/12/2009  . BACK PAIN, LUMBAR 01/21/2008  . CONSTIPATION 12/20/2007  . RECTAL BLEEDING 12/20/2007  . Disorder resulting from impaired renal function 12/20/2007  . NEPHROPATHY, DIABETIC 12/06/2006  . Dyslipidemia 12/06/2006  . Essential hypertension 12/06/2006  . COPD pfts pending/ still smoking  12/06/2006  . GERD 12/06/2006  . DIVERTICULOSIS, COLON 12/06/2006  . DIZZINESS OR VERTIGO 12/06/2006     Palliative Care Assessment & Plan    Code Status:  DNR  Goals of Care:  Feeding tube: Pull panda. No PEG. Comfort feeds as pt can tolerate. Family understands aspiration risk with all interventions  Desire for further Chaplaincy support:no  3. Symptom Management:  Pain: Pt verbalizing leg pain. Started on low dose ms04 concentrate q4 prn   Delirium: Pt now seems to have more hypoactive delirium. Not agitated. May still want to consider trial of low dose haldol 1 mg BID  5. Prognosis: < 2 weeks . High risk for aspiration in setting of recent sepsis secondary to cholecystitis , and not an operable candidate as well as systolic heart failure with EF 20^  5. Discharge Planning: Hospice facility   Care plan was discussed with Dr. Cruzita Lederer, and children  Thank you for allowing the Palliative Medicine Team to assist in the care of this patient.   Time In: 1600 Time Out: 1800 Total Time 120 Prolonged Time Billed yes    Greater than 50%  of this time was spent counseling and coordinating care related to the above assessment and  plan.   Dory Horn, NP  07/31/2014, 6:12 PM  Please contact Palliative Medicine Team phone at [phone] for questions and concerns.

## 2014-07-31 NOTE — Clinical Social Work Note (Signed)
CSW to continue to follow patient's progress to determine what discharge plan will be.  Family is supposed to speak with palliative today to determine goals of care.  Ervin KnackEric R. Courtnei Ruddell, MSW, LCSWA [phone] 07/31/2014 6:01 PM

## 2014-07-31 NOTE — Progress Notes (Signed)
Speech Language Pathology Treatment: Dysphagia  Patient Details Name: Danny [name] MRN: [id number] DOB: [birth year] Today's Date: 07/31/2014 Time: [id number] SLP Time Calculation (min) (ACUTE ONLY): 9 min  Assessment / Plan / Recommendation Clinical Impression  Pt's status remains similar to yesterday - requires max verbal/tactile cues to awaken and attend to PO trials.  Consumed ice chips with oral holding; swallowed without overt s/s of aspiration, but MS does not allow functional eating at this time.  Recommend allowing ice chips after oral care per pt's preferences.  Will follow for GOC after family meets with Palliative Medicine.   HPI Other Pertinent Information: [age] year old male admitted with slurred speech and right facial droop. Seen in ER with concern for CVA. CT head noted chronic cerebellum and left thalamic infarct. Also wtih AKI, metabolic acidosis, and hypotension. Developled agitation requiring sedatives and transferred to the ICU.    Pertinent Vitals Pain Assessment: Faces Faces Pain Scale: No hurt  SLP Plan  Continue with current plan of care    Recommendations Diet recommendations:  (allow ice chips PRN and after oral care) Medication Administration: Via alternative means              Oral Care Recommendations: Oral care QID Follow up Recommendations:  (tba) Plan: Continue with current plan of care  Danny [name], [place] CCC/SLP Pager [phone]       Blenda MountsCouture, Danny [name] 07/31/2014, 4:20 PM

## 2014-08-01 LAB — GLUCOSE, CAPILLARY: GLUCOSE-CAPILLARY: 123 mg/dL — AB (ref 65–99)

## 2014-08-01 MED ORDER — WHITE PETROLATUM GEL
Status: AC
  Filled 2014-08-01: qty 1

## 2014-08-01 MED ORDER — ASPIRIN 81 MG PO CHEW
81.0000 mg | CHEWABLE_TABLET | Freq: Every day | ORAL | Status: DC
  Administered 2014-08-01: 81 mg via ORAL
  Filled 2014-08-01: qty 1

## 2014-08-01 MED ORDER — MORPHINE SULFATE (CONCENTRATE) 10 MG/0.5ML PO SOLN
5.0000 mg | ORAL | Status: DC | PRN
  Administered 2014-08-01 (×4): 5 mg via ORAL
  Filled 2014-08-01 (×3): qty 0.5

## 2014-08-01 NOTE — Progress Notes (Signed)
Daughter notified of pt room change to 6n15 and being in a camera room.

## 2014-08-01 NOTE — Progress Notes (Signed)
Stopped by to see patient and family after an emotional meeting last night where pt was made comfort care. No family in the room. Panda tube out. Pt has taken sips, but unable to use straw. Has been moved to a camera monitored room for his safety. Spoke to RN about good dietary sources such as ice cream, applesauce, mashed potatoes for pt. He also likes milk with sugar. Appreciate SW involvement. Pt at this point strongly looks like he will be eating only bites and sips at best and maybe transitioning towards EOL. No longer ambulatory, and now incontinent.Eduard Roux. Ayyub Krall, ANP-ACHPN

## 2014-08-01 NOTE — Progress Notes (Signed)
PROGRESS NOTE  Danny [name] ZOX:[id number] DOB: [birth year] DOA: 07/25/2014 PCP: Romero BellingELLISON, SEAN, MD  HPI: 81 yo male was feeling weak. His family noted he had slurred speech and Rt facial droop. He was seen in ER with concern for CVA. He was noted to be hypothermic and blood sugar was 17. He also had AKI, metabolic acidosis with elevated lactic acid, and hypotension. He was seen by stroke service. He was started on Abx, and given dextrose. He was started on warming blanket. He was admitted to SDU. He developed agitation and received several doses of ativan, morphine, and haldol.  STUDIES / EVENTS:  [facility] >> chronic cerebellum and Lt thalamus infarct 5/07 Echo >> EF 20-25%, moD AS/AI, RVSP 65 5/07 Renal u/s >> Markedly thickened gallbladder wall to 14 mm thick with edema/striations within wall without gallstones ; findings are concerning for acalculus cholecystitis 5/9 transfer to ICU for precedex for agitation 5/11 NSVT 5/18 TRH pick up  Subjective / 24 H Interval events - patient confused, no specific complaints  Assessment/Plan: Principal Problem:   Metabolic encephalopathy Active Problems:   CORONARY ATHEROSCLEROSIS, NATIVE VESSEL   Atrial fibrillation   Elevated troponin   Systolic CHF, chronic   Memory loss   Hypoglycemia   Hypothermia   AKI (acute kidney injury)   Renal failure, acute on chronic   Altered mental status   Malnutrition of moderate degree   Severe sepsis   Palliative care encounter    Goals of care - extensive discussions with the patient's 6 children 5/20. We discussed his poor prognosis, his inability to eat or participate in any form of physical therapy. He has multiple medical problems that while stable at this point are very fragile. They understand his prognosis however still have difficulties on deciding what to do about feeding. They agree that patient himself would have not wanted any feeding tube however  they feel like if they discontinue feeding tubes he will die sooner. We discussed natural progression of dementia and ensuing inability to swallow even his own secretions resulting in aspiration regardless of the feeding tubes. Family will carry further conversations with palliative and amongst themselves. - discussed again with family today 5/21. They have come to better acceptance as to his prognosis. They wish for him to be comfortable, his feeding tube has been discontinued last night after discussing with palliative and [facility]  Metabolic encephalopathy/delirium: - in the setting of progression of his dementia Acute of chronic systolic CHF,  - EF 20-25% (07/19/2014), cxr persistent bilateral infiltrate, currently on room air, does have intermittent tachypnea,  - comfort measures PAF, h/o bradycardia, HTN:  ARF  Elevation of lft:  NIDDM2 COPD    Diet: DIET - DYS 1 Room service appropriate?: No; Fluid consistency:: Thin Fluids: none DVT Prophylaxis: SCD  Code Status: DNR Family Communication: d/w daughter  Disposition Plan: [facility]  Consultants:  PCCM  Palliative  Procedures:  None    Antibiotics Zosyn 5/08 >> 5/14   Studies  Dg Chest Port 1 View  07/31/2014   CLINICAL DATA:  Cough.  EXAM: PORTABLE CHEST - 1 VIEW  COMPARISON:  Jul 28, 2014.  FINDINGS: Stable cardiomegaly. No pneumothorax is noted. Stable bilateral interstitial densities are noted in the perihilar and basilar regions consistent with pulmonary edema. Stable bilateral pleural effusions are noted. Narrowing of the subacromial space is noted bilaterally consistent with rotator cuff injury.  IMPRESSION: Stable cardiomegaly and  pulmonary edema and pleural effusions compared to prior exam.   Electronically Signed   By: Lupita Raider, M.D.   On: 07/31/2014 13:40    Objective  Filed Vitals:   07/31/14 0948 07/31/14 1500 07/31/14 2126 08/01/14 0546  BP: 136/67 131/88 133/60 147/122   Pulse: 73 65 82 103  Temp:  98.1 F (36.7 C) 98 F (36.7 C) 98.3 F (36.8 C)  TempSrc:  Axillary Oral Oral  Resp:  Height:      Weight:    70.2 kg (154 lb 12.2 oz)  SpO2: 100% 94% 90% 92%    Intake/Output Summary (Last 24 hours) at 08/01/14 0832 Last data filed at 08/01/14 0333  Gross per 24 hour  Intake      0 ml  Output   1400 ml  Net  -1400 ml   Filed Weights   07/30/14 0515 07/31/14 0533 08/01/14 0546  Weight: 67.903 kg (149 lb 11.2 oz) 66.9 kg (147 lb 7.8 oz) 70.2 kg (154 lb 12.2 oz)    Exam:  General:  Sleeping, wakes up but drifts back asleep  Cardiovascular: RRR without MRG, 2+ peripheral pulses  Respiratory: coarse breath sounds anterior   Data Reviewed: Basic Metabolic Panel:  Recent Labs Lab 07/27/14 0204 07/28/14 0420 07/29/14 0835 07/30/14 1000 07/31/14 0415  NA 153* 143 140 140 139  K 3.9 4.0 3.6 4.2 3.9  CL 121* 113* 109 106 107  CO2 GLUCOSE 182* 222* 111* 137* 139*  BUN 49* 43* 39* 38* 41*  CREATININE 1.27* 1.10 1.12 1.09 0.96  CALCIUM 8.6* 8.4* 8.1* 8.4* 8.3*  MG  --  1.9  --   --   --   PHOS  --  2.8  --   --   --    Liver Function Tests:  Recent Labs Lab 07/29/14 0835  AST 35  ALT 36  ALKPHOS 97  BILITOT 1.7*  PROT 5.3*  ALBUMIN 2.0*   CBC:  Recent Labs Lab 07/28/14 0420 07/29/14 0835  WBC 11.5* 12.2*  HGB 10.1* 10.4*  HCT 32.6* 33.0*  MCV 94.8 94.0  PLT 123* 131*   BNP (last 3 results)  Recent Labs  08-14-2014 1944  BNP >4500.0*   CBG:  Recent Labs Lab 07/31/14 0314 07/31/14 0806 07/31/14 1224 07/31/14 1736 07/31/14 2122  GLUCAP 127* 119* 146* 131* 122*    Recent Results (from the past 240 hour(s))  Clostridium Difficile by PCR     Status: None   Collection Time: 07/24/14 11:00 PM  Result Value Ref Range Status   C difficile by pcr NEGATIVE NEGATIVE Final     Scheduled Meds: . antiseptic oral rinse  7 mL Mouth Rinse q12n4p  . arformoterol  15 mcg Nebulization BID  .  aspirin  81 mg Oral Daily  . budesonide (PULMICORT) nebulizer solution  0.25 mg Nebulization BID  . chlorhexidine  15 mL Mouth Rinse BID  . pantoprazole (PROTONIX) IV  40 mg Intravenous Q24H   Continuous Infusions:   Time spent: 15 minutes, more than 50% discussing with family  Pamella Pert, MD [facility] Pager [phone]. If 7 PM - 7 AM, please contact night-coverage at [email or web address], password [facility] 08/01/2014, 8:32 AM  LOS: 14 days

## 2014-08-01 NOTE — Clinical Social Work Note (Signed)
Clinical Social Worker continuing to follow patient family for support and discharge planning needs.  CSW spoke with patient daughter over the phone to further discuss the possibility for residential hospice placement.  Patient daughter states that they are familiar with the process and [facility]  CS[facility] who will work up referral pending bed availability.  Patient [facility], however adamant that [facility].  CSW to initiate referral to both avenues and update patient family.  MD updated.  CSW remains available for support and to facilitate patient discharge needs.  Danny [name], [place]CS[place] [phone]

## 2014-08-12 NOTE — Discharge Summary (Signed)
Physician Death Summary  Danny [name] QMV:[id number] DOB: [birth year] DOA: 2014/07/22  PCP: Romero Belling, MD  Admit date: 2014-07-22 Death date: Aug 06, 2014  Discharge Diagnoses:  Principal Problem:   Metabolic encephalopathy Active Problems:   CORONARY ATHEROSCLEROSIS, NATIVE VESSEL   Atrial fibrillation   Elevated troponin   Systolic CHF, chronic   Memory loss   Hypoglycemia   Hypothermia   AKI (acute kidney injury)   Renal failure, acute on chronic   Altered mental status   Malnutrition of moderate degree   Severe sepsis   Palliative care encounter  History of present illness:  81 yo male was feeling weak. His family noted he had slurred speech and Rt facial droop. He was seen in ER with concern for CVA. He was noted to be hypothermic and blood sugar was 17. He also had AKI, metabolic acidosis with elevated lactic acid, and hypotension. He was seen by stroke service. He was started on Abx, and given dextrose. He was started on warming blanket. He was admitted to SDU. He developed agitation and received several doses of ativan, morphine, and haldol.  Hospital Course:  Goals of care - patient with protracted hospitalization and progressive decline for the past 9 months at home even prior to this. We held discussions with the patient's 6 children on 5/20 regarding his poor prognosis, his inability to eat or participate in any form of physical therapy. His prognosis was deemed very guarded and was discussed at length between this MD, palliative consult and family that there this is unlikely to be reversible. We discussed natural progression of dementia and ensuing inability to swallow even his own secretions resulting in aspiration regardless of the feeding tubes. Family confirmed that patient was very independent and would have never accepted life support or feeding tubes. Family wished for him to be comfortable, his feeding tube was discontinued and he was allowed comfort  feeding and his care was transitioned towards comfort with plans for am discharge to [facility] place. Prior to discharge patient passed away early hours 06-Aug-2022. Metabolic encephalopathy/delirium Acute of chronic systolic CHF with EF 20-25% PAF, h/o bradycardia HTN ARF  Liver enzyme elevation NIDDM2 COPD   Consultations:  PCCM  Palliative  Significant Diagnostic Studies: Ct Head Wo Contrast  07/27/2014   CLINICAL DATA:  Altered mental status, history type 2 diabetes, smoking, hypertension, coronary artery disease, COPD  EXAM: CT HEAD WITHOUT CONTRAST  TECHNIQUE: Contiguous axial images were obtained from the base of the skull through the vertex without intravenous contrast.  COMPARISON:  07/22/2014  FINDINGS: RIGHT optic globe prosthesis.  Generalized atrophy.  Normal ventricular morphology.  No midline shift or mass effect.  Brain parenchyma otherwise normal appearance.  No intracranial hemorrhage, mass lesion, or evidence acute infarction.  No extra-axial fluid collections.  Bones and sinuses unremarkable.  IMPRESSION: Generalized atrophy.  No acute intracranial abnormalities.   Electronically Signed   By: Ulyses Southward M.D.   On: 07/27/2014 21:21   Ct Head Wo Contrast  Jul 22, 2014   CLINICAL DATA:  Code stroke. Right-sided facial droop in unresponsiveness.  EXAM: CT HEAD WITHOUT CONTRAST  TECHNIQUE: Contiguous axial images were obtained from the base of the skull through the vertex without intravenous contrast.  COMPARISON:  None.  FINDINGS: Asymmetric CSF space over the left parietal lobe near the vertex measures approximately 3 cm and is somewhat ovoid in shape, possibly an incidental arachnoid cyst. There is moderate cerebral atrophy. There is no evidence of acute cortical infarct, intracranial hemorrhage,  mass, midline shift, or extra-axial fluid collection. Patchy hypodensities in the subcortical and deep cerebral white matter bilaterally are nonspecific but compatible with mild chronic small  vessel ischemic disease. Small, chronic infarcts are noted in the cerebellum bilaterally. There is also a punctate, 3 mm focus of hypoattenuation in the left thalamus compatible with a lacunar infarct.  Prior left cataract extraction and a right ocular prosthesis are noted. Cerumen is noted in the left EAC. Mastoid air cells are clear. Mild paranasal sinus mucosal thickening is noted bilaterally. There is moderate calcified atherosclerosis at the skull base.  IMPRESSION: 1. No evidence of acute intracranial hemorrhage or acute large territory infarct. 2. Small infarcts in the cerebellum which appear chronic. Punctate lacunar infarct in the left thalamus of indeterminate age. 3. Mild chronic small vessel ischemic disease and moderate cerebral atrophy.  These results were called by telephone at the time of interpretation on 2014/07/27 at 5:30 pm to Dr. Thad Ranger, who verbally acknowledged these results.   Electronically Signed   By: Sebastian Ache   On: 27-Jul-2014 17:31   US Renal  07/19/2014   CLINICAL DATA:  Acute kidney injury.  Diabetes.  Hypertension.  EXAM: RENAL / URINARY TRACT ULTRASOUND COMPLETE  COMPARISON:  None.  FINDINGS: Right Kidney:  Length: 9.4 cm. Echogenicity within normal limits. No mass or hydronephrosis visualized. Possible 3 mm calculus in the upper pole. 1.3 cm lower pole cyst.  Left Kidney:  Length: 10.1 cm. Echogenicity within normal limits. No mass or hydronephrosis visualized. 6.4 cm exophytic lower pole cyst. This contains an eccentric soft tissue component.  Bladder:  Appears normal for degree of bladder distention.  IMPRESSION: 1. No hydronephrosis. 2. 6.4 cm complex lower pole left renal cyst with a soft tissue component. This is concerning for the possibility of a neoplasm. This could be further evaluated with MRI of the kidneys, preferably without and with contrast on an outpatient basis if the patient's renal function returns to an acceptable level. 3. 3 mm probable upper pole right  renal calculus.   Electronically Signed   By: Beckie Salts M.D.   On: 07/19/2014 10:14   US Abdomen Port  07/19/2014   CLINICAL DATA:  Abnormal LFTs, history hypertension, diabetes, hyperlipidemia, renal insufficiency  EXAM: ULTRASOUND PORTABLE ABDOMEN  COMPARISON:  Renal ultrasound 07/19/2014  FINDINGS: Gallbladder: Markedly thickened gallbladder wall up to 14 mm thick. Edema within wall. No definite shadowing calculi identified. Unable to assess for presence of a sonographic Murphy sign due to patient being combative, confused and noncompliant with imaging.  Common bile duct: Diameter: 6 mm diameter, normal for age  Liver: No focal abnormalities. Hepatopetal portal venous flow. Slightly inhomogeneous with question slightly increased echogenicity, cannot exclude fatty infiltration.  IVC: Normal appearance  Pancreas: Normal appearance  Spleen: Small and minimally lobular, 6.3 cm length. No focal abnormalities  Right Kidney: Length: 5.5 cm. Thinned cortex. Small cyst at inferior pole 16 x 10 x 9 mm. No additional mass or hydronephrosis.  Left Kidney: Inadequately visualized due to patient noncompliance with imaging. This was adequately visualized on the earlier renal ultrasound of same date.  Abdominal aorta: Proximally normal caliber, mid to distal portions obscured by bowel gas.  Other findings: Small LEFT pleural effusion. Small amount of perisplenic ascites.  IMPRESSION: Small LEFT pleural effusion and small amount of perisplenic ascites.  Inadequate visualization of LEFT kidney due to patient condition, please see earlier renal ultrasound exam lobe same date.  Minimal fatty infiltration of liver  Markedly thickened  gallbladder wall to 14 mm thick with edema/striations within wall without gallstones ; findings are concerning for acalculus cholecystitis.   Electronically Signed   By: Ulyses Southward M.D.   On: 07/19/2014 19:43   Dg Chest Port 1 View  07/31/2014   CLINICAL DATA:  Cough.  EXAM: PORTABLE CHEST - 1  VIEW  COMPARISON:  Jul 28, 2014.  FINDINGS: Stable cardiomegaly. No pneumothorax is noted. Stable bilateral interstitial densities are noted in the perihilar and basilar regions consistent with pulmonary edema. Stable bilateral pleural effusions are noted. Narrowing of the subacromial space is noted bilaterally consistent with rotator cuff injury.  IMPRESSION: Stable cardiomegaly and pulmonary edema and pleural effusions compared to prior exam.   Electronically Signed   By: Lupita Raider, M.D.   On: 07/31/2014 13:40   Dg Chest Port 1 View  07/28/2014   CLINICAL DATA:  Status post feeding catheter placement  EXAM: PORTABLE CHEST - 1 VIEW  COMPARISON:  07/27/2014  FINDINGS: Feeding catheter is noted extending towards stomach. Cardiac shadow is stable. Left pleural effusion and retrocardiac consolidation is again identified. Diffuse vascular congestion is again seen. No bony abnormality is noted.  IMPRESSION: Vascular congestion with pulmonary edema as well as left lower lobe consolidation and left-sided pleural effusion.  Feeding tube extending into the stomach.   Electronically Signed   By: Alcide Clever M.D.   On: 07/28/2014 15:44   Dg Chest Port 1 View  07/27/2014   CLINICAL DATA:  81 year old male with diabetes, high blood pressure and atrial fibrillation. Respiratory failure. Shortness breath. Subsequent encounter.  EXAM: PORTABLE CHEST - 1 VIEW  COMPARISON:  07/22/2014.  FINDINGS: Lung bases not entirely included on present exam.  Cardiomegaly.  Hazy parenchymal changes left mid-lower lung zone right lung base may represent posteriorly layering pleural fluid. Basilar infiltrate not excluded in proper clinical setting.  Pulmonary vascular congestion/ pulmonary edema.  Calcified tortuous aorta.  No gross pneumothorax.  Bilateral shoulder joint degenerative changes.  IMPRESSION: Overall no significant change in asymmetric airspace disease as detailed above.   Electronically Signed   By: Lacy Duverney M.D.    On: 07/27/2014 07:34   Dg Chest Port 1 View  07/22/2014   CLINICAL DATA:  81 year old male with respiratory failure, shortness of breath, elevated troponin. Initial encounter.  EXAM: PORTABLE CHEST - 1 VIEW  COMPARISON:  07/20/2014 and earlier.  FINDINGS: Portable AP semi upright view at 0644 hrs. Severe cardiomegaly. Stable mediastinal contours. Dense retrocardiac opacity with bilateral pulmonary veiling opacity. No pneumothorax. Stable ventilation since 07/20/2014.  IMPRESSION: Cardiomegaly with bilateral pleural effusions and lower lobe collapse or consolidation, stable since 07/20/2014.   Electronically Signed   By: Odessa Fleming M.D.   On: 07/22/2014 08:05   Dg Chest Port 1 View  07/20/2014   CLINICAL DATA:  Respiratory failure .  EXAM: PORTABLE CHEST - 1 VIEW  COMPARISON:  07/19/2014.  FINDINGS: Mediastinum hilar structures are normal. Cardiomegaly with diffuse bilateral pulmonary progressive infiltrates. Bilateral pleural effusions are present on today's exam. No pneumothorax.  IMPRESSION: Cardiomegaly with diffuse pulmonary infiltrates. Bilateral pleural effusions. These findings are consistent with congestive heart failure.   Electronically Signed   By: Maisie Fus  Register   On: 07/20/2014 07:45   Dg Chest Port 1 View  07/19/2014   CLINICAL DATA:  Short of breath.  Follow-up exam.  EXAM: PORTABLE CHEST - 1 VIEW  COMPARISON:  08/05/2014.  FINDINGS: Stable enlargement of the cardiopericardial silhouette. No mediastinal or hilar masses. Lungs  are hyperexpanded with thickened interstitial markings bilaterally, stable. No lung consolidation or convincing edema. No pleural effusion or pneumothorax.  IMPRESSION: 1. No convincing acute cardiopulmonary disease. 2. Cardiomegaly. Lung hyperexpansion interstitial thickening, chronic, consistent with COPD.   Electronically Signed   By: Amie Portland M.D.   On: 07/19/2014 08:17   Dg Chest Port 1 View  08/09/2014   CLINICAL DATA:  Elevated troponin.  EXAM: PORTABLE CHEST  - 1 VIEW  COMPARISON:  02/20/2014.  FINDINGS: Stable enlarged cardiac silhouette, hyperexpanded lungs and diffusely prominent interstitial markings. Bilateral shoulder degenerative changes with superior migration of both humeral heads and bony remodeling on the right.  IMPRESSION: 1. Stable cardiomegaly and changes of COPD. 2. Bilateral shoulder degenerative changes and chronic rotator cuff tears.   Electronically Signed   By: Beckie Salts M.D.   On: 07/20/2014 19:07   Dg Abd Portable 1v  07/28/2014   CLINICAL DATA:  NG tube placement.  EXAM: PORTABLE ABDOMEN - 1 VIEW  COMPARISON:  07/26/2014  FINDINGS: Enteric tube is present with tip over the stomach in the left upper quadrant. Bowel gas pattern is nonobstructive. Persistent left base opacification. Remainder the exam is unchanged.  IMPRESSION: Nonobstructive bowel gas pattern.  Enteric tube with tip over the stomach in the left upper quadrant.   Electronically Signed   By: Elberta Fortis M.D.   On: 07/28/2014 15:35   Dg Abd Portable 1v  07/26/2014   CLINICAL DATA:  Feeding tube placement.  EXAM: PORTABLE ABDOMEN - 1 VIEW  COMPARISON:  07/26/2014  FINDINGS: A small bore feeding tube is identified overlying the mid stomach.  Consolidation/ atelectasis in the left lower lung again noted.  The bowel gas pattern is unremarkable.  IMPRESSION: Small bore feeding tube with tip overlying the mid stomach.   Electronically Signed   By: Harmon Pier M.D.   On: 07/26/2014 17:00   Dg Abd Portable 1v  07/26/2014   CLINICAL DATA:  Assess feeding tube placement  EXAM: PORTABLE ABDOMEN - 1 VIEW  COMPARISON:  Radiograph 07/22/2014  FINDINGS: There is a feeding tube with tip in the gastric antrum versus first portion the duodenum. No dilated loops of large or small bowel.  IMPRESSION: Feeding tube with tip in the distal stomach/proximal duodenum.   Electronically Signed   By: Genevive Bi M.D.   On: 07/26/2014 14:00   Dg Abd Portable 1v  07/22/2014   CLINICAL DATA:   Check feeding tube placement  EXAM: PORTABLE ABDOMEN - 1 VIEW  COMPARISON:  None.  FINDINGS: Feeding catheter is noted within the stomach coiled in the fundus. Scattered large and small bowel gas is noted. Degenerative changes of the lumbar spine are seen.  IMPRESSION: Feeding tube within the stomach.   Electronically Signed   By: Alcide Clever M.D.   On: 07/22/2014 10:38    Microbiology: Recent Results (from the past 240 hour(s))  Clostridium Difficile by PCR     Status: None   Collection Time: 07/24/14 11:00 PM  Result Value Ref Range Status   C difficile by pcr NEGATIVE NEGATIVE Final     Labs: Basic Metabolic Panel:  Recent Labs Lab 07/27/14 0204 07/28/14 0420 07/29/14 0835 07/30/14 1000 07/31/14 0415  NA 153* 143 140 140 139  K 3.9 4.0 3.6 4.2 3.9  CL 121* 113* 109 106 107  CO2 24 22 22 25 23   GLUCOSE 182* 222* 111* 137* 139*  BUN 49* 43* 39* 38* 41*  CREATININE 1.27* 1.10 1.12 1.09  0.96  CALCIUM 8.6* 8.4* 8.1* 8.4* 8.3*  MG  --  1.9  --   --   --   PHOS  --  2.8  --   --   --    Liver Function Tests:  Recent Labs Lab 07/29/14 0835  AST 35  ALT 36  ALKPHOS 97  BILITOT 1.7*  PROT 5.3*  ALBUMIN 2.0*    Recent Labs Lab 07/29/14 0835  LIPASE 28   CBC:  Recent Labs Lab 07/28/14 0420 07/29/14 0835  WBC 11.5* 12.2*  HGB 10.1* 10.4*  HCT 32.6* 33.0*  MCV 94.8 94.0  PLT 123* 131*   BNP: BNP (last 3 results)  Recent Labs  07/31/2014 1944  BNP >4500.0*   CBG:  Recent Labs Lab 07/31/14 0806 07/31/14 1224 07/31/14 1736 07/31/14 2122 08/01/14 2144  GLUCAP 119* 146* 131* 122* 123*   Signed:  Pamella PertGHERGHE, COSTIN  [facility] 09/25/2014, 4:14 PM

## 2014-08-12 DEATH — deceased

## 2016-10-14 IMAGING — CT CT HEAD W/O CM
1 of 2 series · 15 of 30 positions shown, 19 images · non-contrast
Comparison: None.

CLINICAL DATA: Code stroke. Right-sided facial droop in
unresponsiveness.

EXAM:
CT HEAD WITHOUT CONTRAST
TECHNIQUE: Contiguous axial images were obtained from the base of the skull
through the vertex without intravenous contrast.

[Series 3: head 2.0 h70h · axial · 0.44mm/px · z∈[-84,+58]mm · 15 of 79 slices shown, 19 images]
[im 4/79  brain]
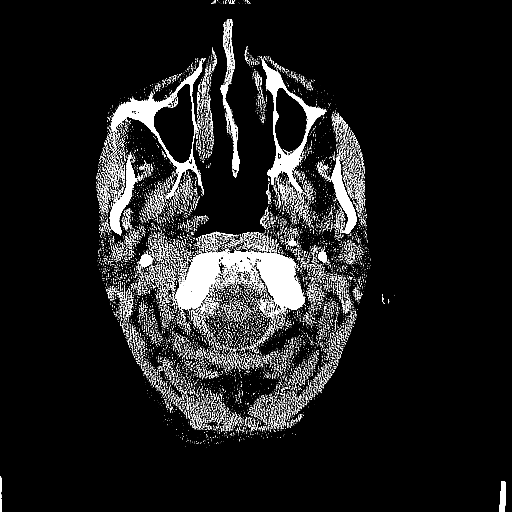
[im 4/79  bone]
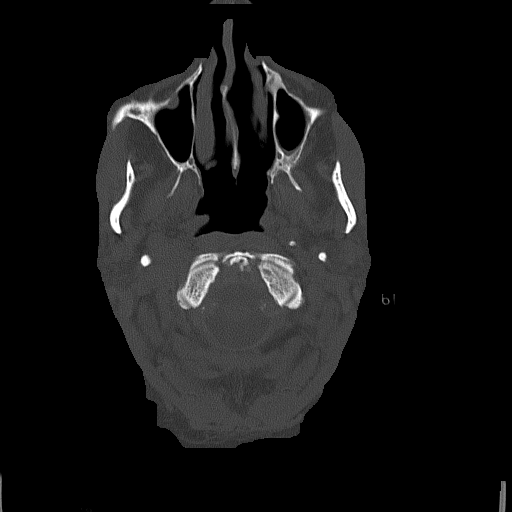
[im 12/79  brain]
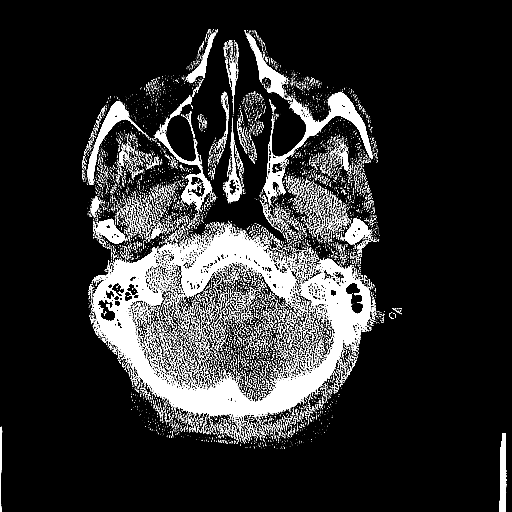
[im 15/79  brain]
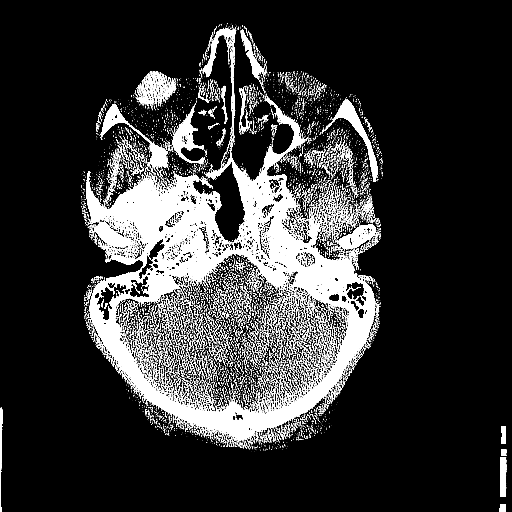
[im 19/79  brain]
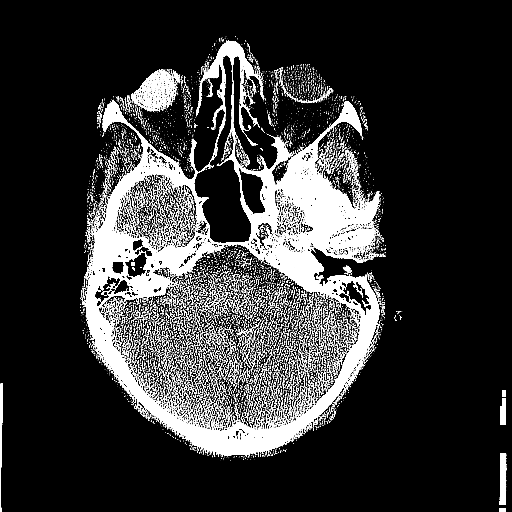
[im 27/79  brain]
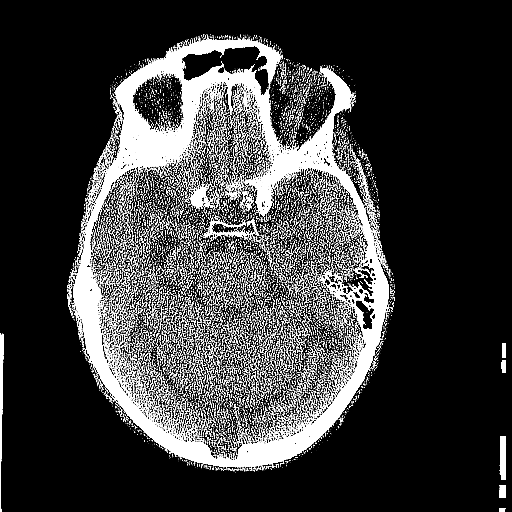
[im 27/79  bone]
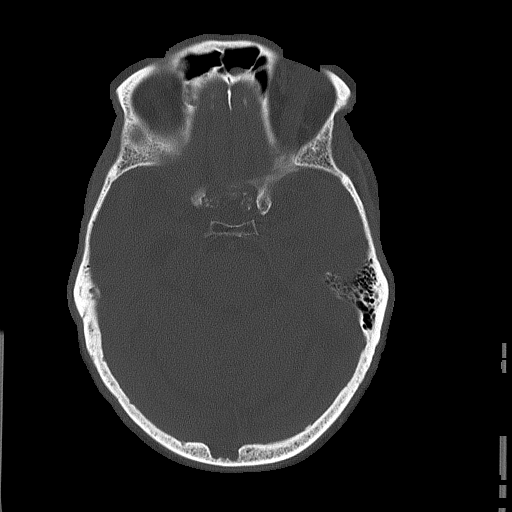
[im 30/79  brain]
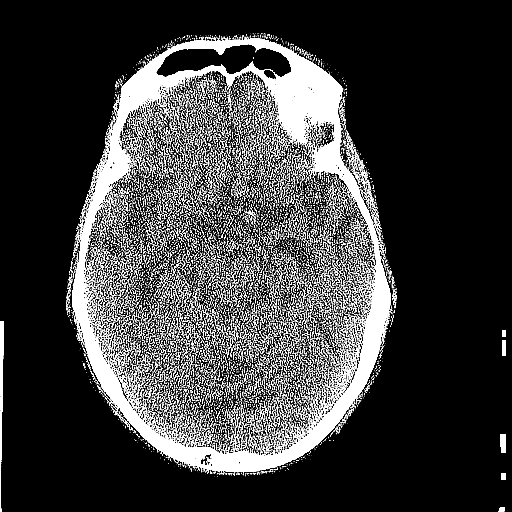
[im 34/79  brain]
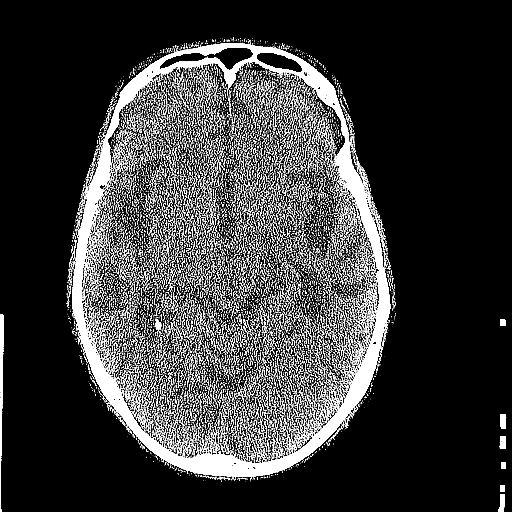
[im 41/79  brain]
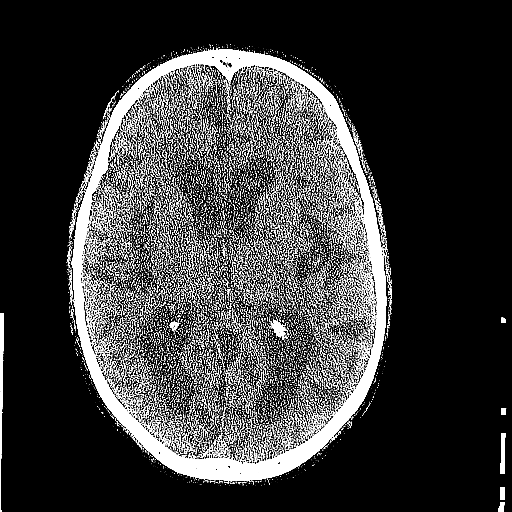
[im 45/79  brain]
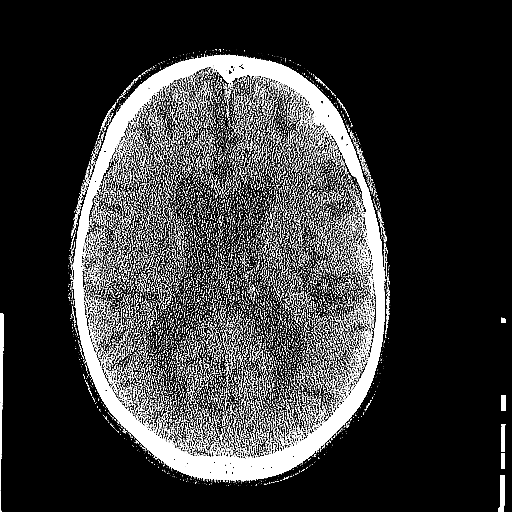
[im 45/79  bone]
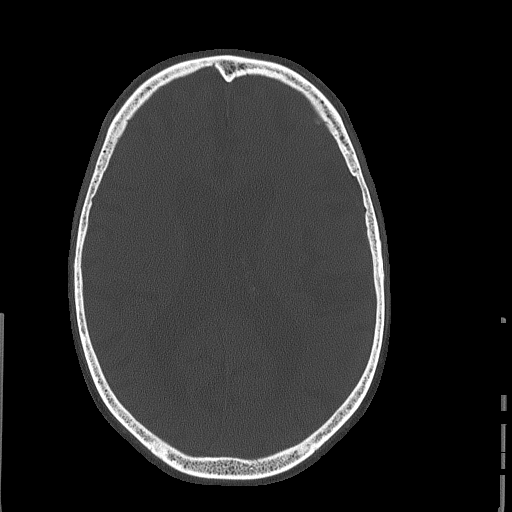
[im 49/79  brain]
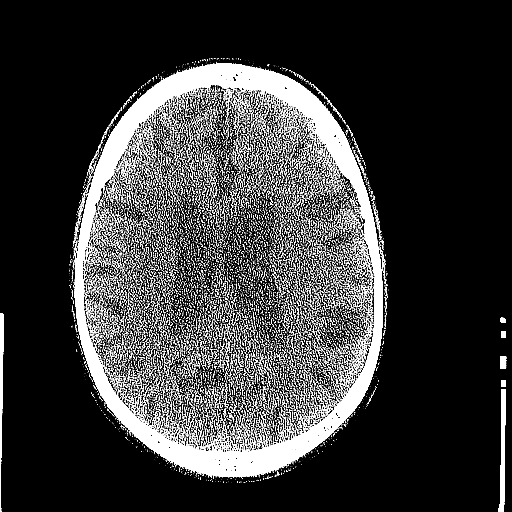
[im 56/79  brain]
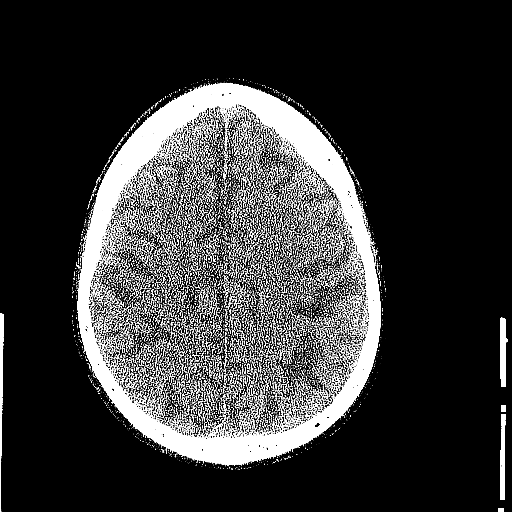
[im 60/79  brain]
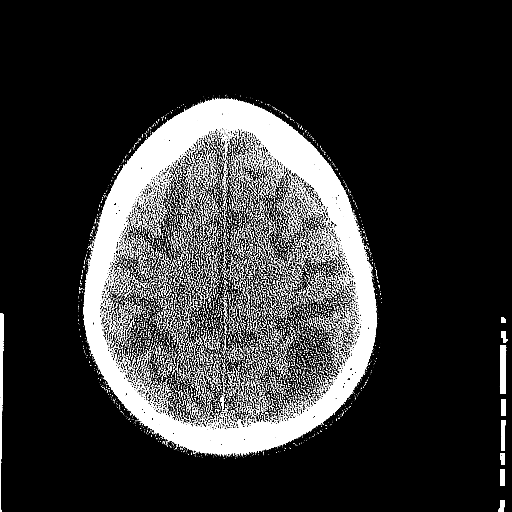
[im 64/79  brain]
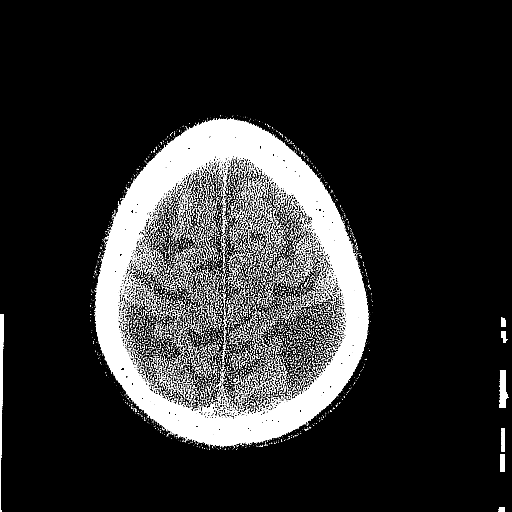
[im 64/79  bone]
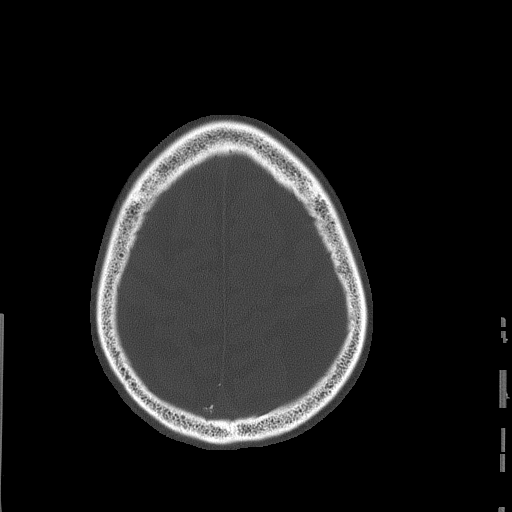
[im 71/79  brain]
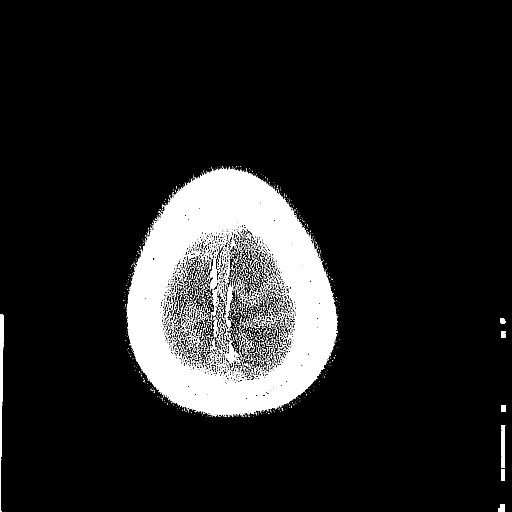
[im 75/79  brain]
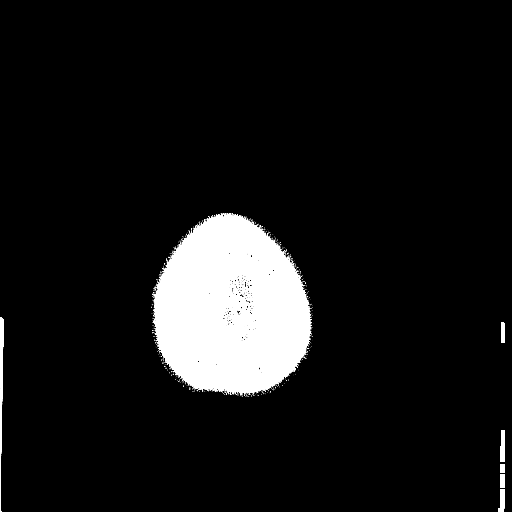

[15 of 30 positions shown; findings below may reference images not displayed]

FINDINGS: Asymmetric CSF space over the left parietal lobe near the vertex
measures approximately 3 cm and is somewhat ovoid in shape, possibly
an incidental arachnoid cyst. There is moderate cerebral atrophy.
There is no evidence of acute cortical infarct, intracranial
hemorrhage, mass, midline shift, or extra-axial fluid collection.
Patchy hypodensities in the subcortical and deep cerebral white
matter bilaterally are nonspecific but compatible with mild chronic
small vessel ischemic disease. Small, chronic infarcts are noted in
the cerebellum bilaterally. There is also a punctate, 3 mm focus of
hypoattenuation in the left thalamus compatible with a lacunar
infarct.

Prior left cataract extraction and a right ocular prosthesis are
noted. Cerumen is noted in the left EAC. Mastoid air cells are
clear. Mild paranasal sinus mucosal thickening is noted bilaterally.
There is moderate calcified atherosclerosis at the skull base.
IMPRESSION: 1. No evidence of acute intracranial hemorrhage or acute large
territory infarct.
2. Small infarcts in the cerebellum which appear chronic. Punctate
lacunar infarct in the left thalamus of indeterminate age.
3. Mild chronic small vessel ischemic disease and moderate cerebral
atrophy.

These results were called by telephone at the time of interpretation
on 07/18/2014 at [DATE] to Dr. Joel Martins, who verbally acknowledged
these results.

## 2016-10-15 IMAGING — CR DG CHEST 1V PORT
1 series · 1 of 1 positions shown · non-contrast
Comparison: 07/18/2014.

CLINICAL DATA: Short of breath.  Follow-up exam.

EXAM:
PORTABLE CHEST - 1 VIEW

[AP]
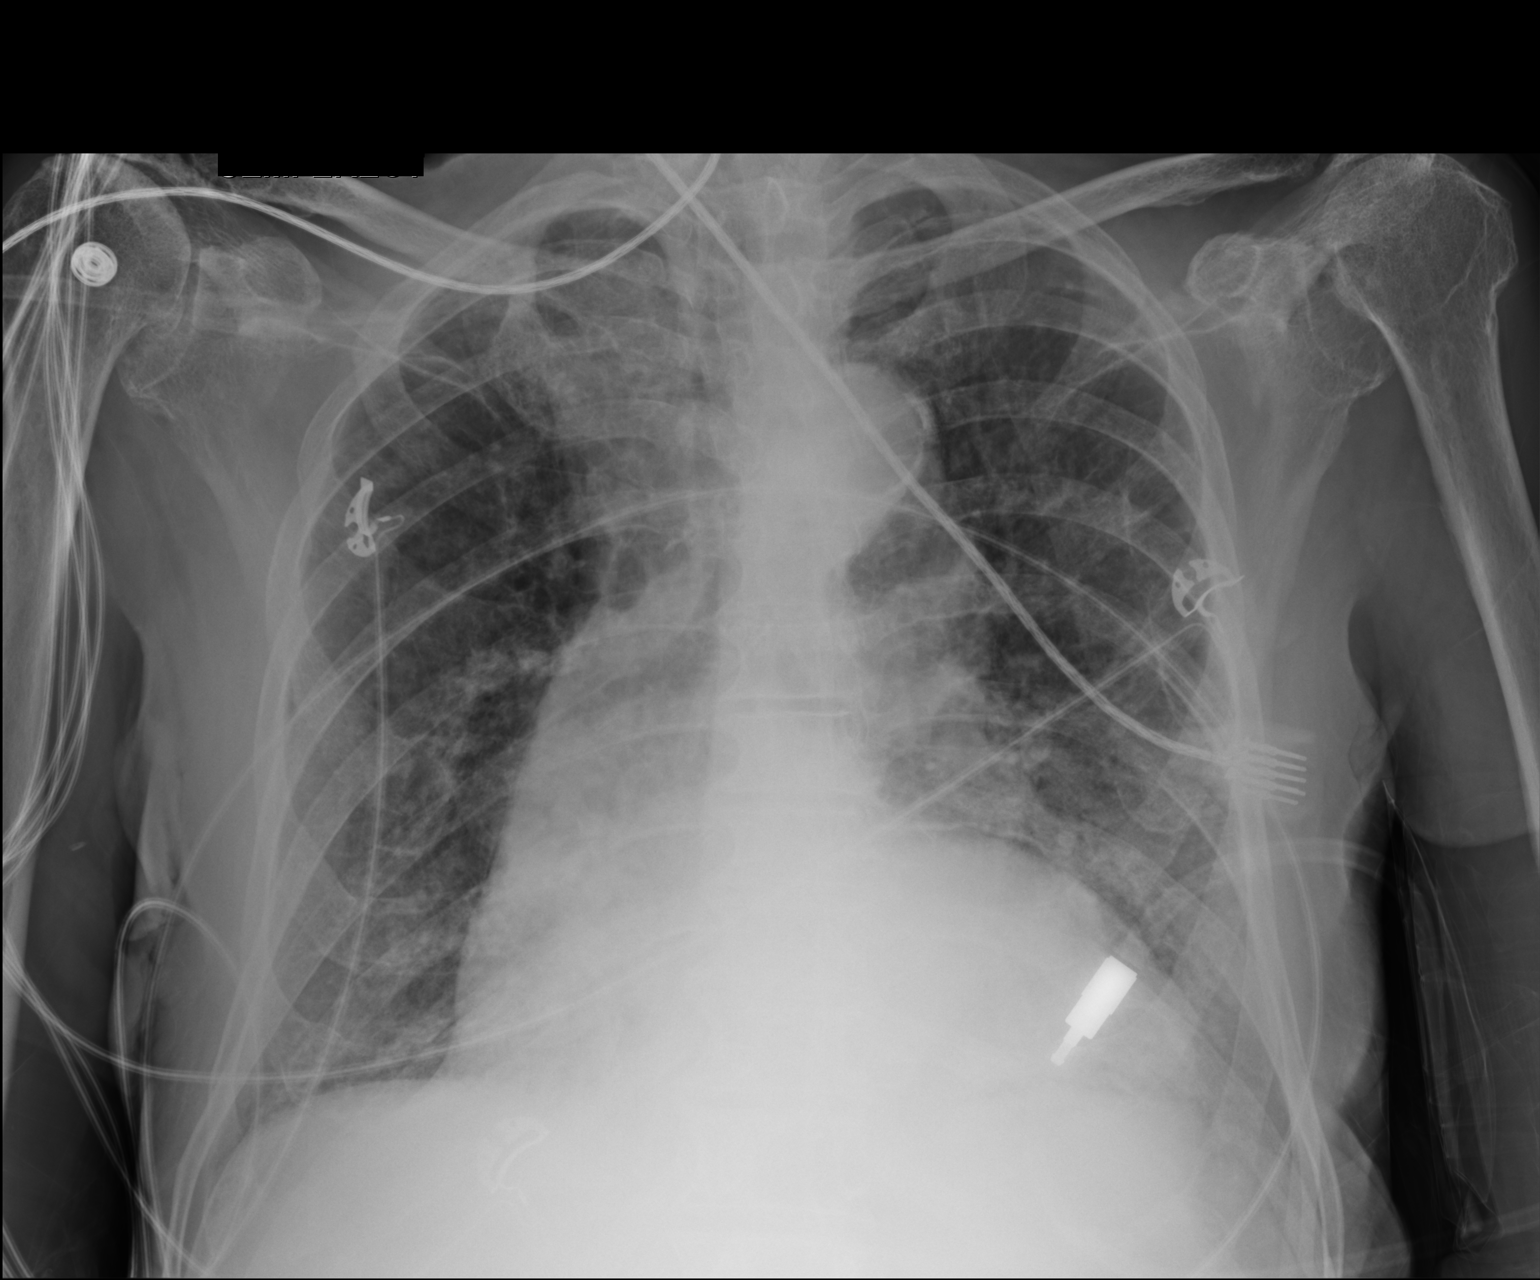

[1 of 1 positions shown; findings below may reference images not displayed]

FINDINGS: Stable enlargement of the cardiopericardial silhouette. No
mediastinal or hilar masses. Lungs are hyperexpanded with thickened
interstitial markings bilaterally, stable. No lung consolidation or
convincing edema. No pleural effusion or pneumothorax.
IMPRESSION: 1. No convincing acute cardiopulmonary disease.
2. Cardiomegaly. Lung hyperexpansion interstitial thickening,
chronic, consistent with COPD.
